# Patient Record
Sex: Male | Born: 1960 | Race: White | Hispanic: No | Marital: Single | Smoking: Current every day smoker
Health system: Southern US, Community
[De-identification: ages and names within clinical notes are randomized; demographics above are authoritative.]

## PROBLEM LIST (undated history)

## (undated) HISTORY — PX: KNEE ARTHROSCOPY: SUR90

---

## 2015-01-14 ENCOUNTER — Encounter (HOSPITAL_COMMUNITY): Payer: Self-pay

## 2015-01-14 ENCOUNTER — Inpatient Hospital Stay (HOSPITAL_COMMUNITY)
Admission: EM | Admit: 2015-01-14 | Discharge: 2015-01-19 | DRG: 637 | Disposition: A | Discharge status: Home / Self Care | Payer: 59 | Attending: Internal Medicine | Admitting: Internal Medicine

## 2015-01-14 ENCOUNTER — Emergency Department (HOSPITAL_COMMUNITY): Payer: 59

## 2015-01-14 DIAGNOSIS — E876 Hypokalemia: Secondary | ICD-10-CM | POA: Diagnosis present

## 2015-01-14 DIAGNOSIS — Z79891 Long term (current) use of opiate analgesic: Secondary | ICD-10-CM

## 2015-01-14 DIAGNOSIS — E871 Hypo-osmolality and hyponatremia: Secondary | ICD-10-CM | POA: Diagnosis present

## 2015-01-14 DIAGNOSIS — F1020 Alcohol dependence, uncomplicated: Secondary | ICD-10-CM | POA: Diagnosis present

## 2015-01-14 DIAGNOSIS — Z9181 History of falling: Secondary | ICD-10-CM | POA: Diagnosis not present

## 2015-01-14 DIAGNOSIS — E1165 Type 2 diabetes mellitus with hyperglycemia: Secondary | ICD-10-CM | POA: Diagnosis present

## 2015-01-14 DIAGNOSIS — F10230 Alcohol dependence with withdrawal, uncomplicated: Secondary | ICD-10-CM | POA: Diagnosis not present

## 2015-01-14 DIAGNOSIS — F17210 Nicotine dependence, cigarettes, uncomplicated: Secondary | ICD-10-CM | POA: Diagnosis present

## 2015-01-14 DIAGNOSIS — Z681 Body mass index (BMI) 19 or less, adult: Secondary | ICD-10-CM

## 2015-01-14 DIAGNOSIS — E43 Unspecified severe protein-calorie malnutrition: Secondary | ICD-10-CM | POA: Diagnosis present

## 2015-01-14 DIAGNOSIS — E119 Type 2 diabetes mellitus without complications: Secondary | ICD-10-CM | POA: Diagnosis not present

## 2015-01-14 DIAGNOSIS — R739 Hyperglycemia, unspecified: Secondary | ICD-10-CM | POA: Diagnosis not present

## 2015-01-14 DIAGNOSIS — R627 Adult failure to thrive: Secondary | ICD-10-CM | POA: Diagnosis present

## 2015-01-14 DIAGNOSIS — R000 Tachycardia, unspecified: Secondary | ICD-10-CM | POA: Diagnosis present

## 2015-01-14 DIAGNOSIS — F1010 Alcohol abuse, uncomplicated: Secondary | ICD-10-CM

## 2015-01-14 DIAGNOSIS — F17200 Nicotine dependence, unspecified, uncomplicated: Secondary | ICD-10-CM | POA: Diagnosis present

## 2015-01-14 DIAGNOSIS — R531 Weakness: Secondary | ICD-10-CM | POA: Diagnosis present

## 2015-01-14 LAB — MRSA PCR SCREENING: MRSA BY PCR: NEGATIVE

## 2015-01-14 LAB — CBC WITH DIFFERENTIAL/PLATELET
Basophils Absolute: 0 10*3/uL (ref 0.0–0.1)
Basophils Relative: 0 % (ref 0–1)
EOS ABS: 0 10*3/uL (ref 0.0–0.7)
Eosinophils Relative: 0 % (ref 0–5)
HCT: 38.5 % — ABNORMAL LOW (ref 39.0–52.0)
Hemoglobin: 13.7 g/dL (ref 13.0–17.0)
LYMPHS ABS: 1.4 10*3/uL (ref 0.7–4.0)
Lymphocytes Relative: 30 % (ref 12–46)
MCH: 32.6 pg (ref 26.0–34.0)
MCHC: 35.6 g/dL (ref 30.0–36.0)
MCV: 91.7 fL (ref 78.0–100.0)
MONO ABS: 0.5 10*3/uL (ref 0.1–1.0)
Monocytes Relative: 10 % (ref 3–12)
NEUTROS PCT: 60 % (ref 43–77)
Neutro Abs: 2.7 10*3/uL (ref 1.7–7.7)
Platelets: 211 10*3/uL (ref 150–400)
RBC: 4.2 MIL/uL — ABNORMAL LOW (ref 4.22–5.81)
RDW: 12.7 % (ref 11.5–15.5)
WBC: 4.5 10*3/uL (ref 4.0–10.5)

## 2015-01-14 LAB — GLUCOSE, CAPILLARY
GLUCOSE-CAPILLARY: 479 mg/dL — AB (ref 65–99)
Glucose-Capillary: 427 mg/dL — ABNORMAL HIGH (ref 65–99)

## 2015-01-14 LAB — BASIC METABOLIC PANEL
ANION GAP: 15 (ref 5–15)
BUN: 7 mg/dL (ref 6–20)
BUN: 9 mg/dL (ref 6–20)
CO2: 44 mmol/L — ABNORMAL HIGH (ref 22–32)
CO2: 40 mmol/L — ABNORMAL HIGH (ref 22–32)
CREATININE: 0.58 mg/dL — AB (ref 0.61–1.24)
Calcium: 8.4 mg/dL — ABNORMAL LOW (ref 8.9–10.3)
Calcium: 7.9 mg/dL — ABNORMAL LOW (ref 8.9–10.3)
Chloride: <65 mmol/L — CL (ref 101–111)
Chloride: 74 mmol/L — ABNORMAL LOW (ref 101–111)
Creatinine, Ser: 0.63 mg/dL (ref 0.61–1.24)
GFR calc Af Amer: >60 mL/min (ref >60)
Glucose, Bld: 431 mg/dL — ABNORMAL HIGH (ref 65–99)
Glucose, Bld: 438 mg/dL — ABNORMAL HIGH (ref 65–99)
POTASSIUM: 3.2 mmol/L — AB (ref 3.5–5.1)
Potassium: <2 mmol/L — CL (ref 3.5–5.1)
SODIUM: 129 mmol/L — AB (ref 135–145)
Sodium: 126 mmol/L — ABNORMAL LOW (ref 135–145)

## 2015-01-14 LAB — HEPATIC FUNCTION PANEL
ALT: 13 U/L — AB (ref 17–63)
AST: 17 U/L (ref 15–41)
Albumin: 3 g/dL — ABNORMAL LOW (ref 3.5–5.0)
Alkaline Phosphatase: 128 U/L — ABNORMAL HIGH (ref 38–126)
Bilirubin, Direct: 0.3 mg/dL (ref 0.1–0.5)
Indirect Bilirubin: 0.7 mg/dL (ref 0.3–0.9)
TOTAL PROTEIN: 6 g/dL — AB (ref 6.5–8.1)
Total Bilirubin: 1 mg/dL (ref 0.3–1.2)

## 2015-01-14 LAB — PHOSPHORUS: Phosphorus: 3.1 mg/dL (ref 2.5–4.6)

## 2015-01-14 LAB — MAGNESIUM: MAGNESIUM: 1.6 mg/dL — AB (ref 1.7–2.4)

## 2015-01-14 MED ORDER — SODIUM CHLORIDE 0.9 % IV BOLUS (SEPSIS)
500.0000 mL | Freq: Once | INTRAVENOUS | Status: AC
  Administered 2015-01-14: 500 mL via INTRAVENOUS

## 2015-01-14 MED ORDER — POTASSIUM CHLORIDE CRYS ER 20 MEQ PO TBCR
40.0000 meq | EXTENDED_RELEASE_TABLET | Freq: Once | ORAL | Status: AC
  Administered 2015-01-14: 40 meq via ORAL
  Filled 2015-01-14: qty 2

## 2015-01-14 MED ORDER — INSULIN GLARGINE 100 UNIT/ML ~~LOC~~ SOLN
5.0000 [IU] | Freq: Once | SUBCUTANEOUS | Status: AC
  Administered 2015-01-14: 5 [IU] via SUBCUTANEOUS
  Filled 2015-01-14: qty 0.05

## 2015-01-14 MED ORDER — NICOTINE 14 MG/24HR TD PT24
14.0000 mg | MEDICATED_PATCH | Freq: Every day | TRANSDERMAL | Status: DC
  Administered 2015-01-14 – 2015-01-19 (×6): 14 mg via TRANSDERMAL
  Filled 2015-01-14 (×6): qty 1

## 2015-01-14 MED ORDER — LORAZEPAM 1 MG PO TABS
0.0000 mg | ORAL_TABLET | Freq: Four times a day (QID) | ORAL | Status: AC
Start: 2015-01-14 — End: 2015-01-16
  Administered 2015-01-14 – 2015-01-15 (×3): 1 mg via ORAL
  Filled 2015-01-14 (×2): qty 1
  Filled 2015-01-14: qty 4
  Filled 2015-01-14: qty 1

## 2015-01-14 MED ORDER — VITAMIN B-1 100 MG PO TABS
100.0000 mg | ORAL_TABLET | Freq: Once | ORAL | Status: AC
  Administered 2015-01-14: 100 mg via ORAL
  Filled 2015-01-14: qty 1

## 2015-01-14 MED ORDER — POTASSIUM CHLORIDE 10 MEQ/100ML IV SOLN
10.0000 meq | Freq: Once | INTRAVENOUS | Status: AC
  Administered 2015-01-14: 10 meq via INTRAVENOUS
  Filled 2015-01-14: qty 100

## 2015-01-14 MED ORDER — LORAZEPAM 1 MG PO TABS
1.0000 mg | ORAL_TABLET | Freq: Four times a day (QID) | ORAL | Status: DC | PRN
  Administered 2015-01-15: 1 mg via ORAL
  Filled 2015-01-14: qty 1

## 2015-01-14 MED ORDER — LORAZEPAM 2 MG/ML IJ SOLN: 1.0000 mg | Freq: Four times a day (QID) | INTRAMUSCULAR | Status: DC | PRN

## 2015-01-14 MED ORDER — PROMETHAZINE HCL 25 MG PO TABS
12.5000 mg | ORAL_TABLET | Freq: Four times a day (QID) | ORAL | Status: DC | PRN
  Administered 2015-01-16 – 2015-01-17 (×2): 12.5 mg via ORAL
  Filled 2015-01-14 (×2): qty 1

## 2015-01-14 MED ORDER — INSULIN ASPART 100 UNIT/ML ~~LOC~~ SOLN: 8.0000 [IU] | Freq: Once | SUBCUTANEOUS | Status: DC

## 2015-01-14 MED ORDER — POTASSIUM CHLORIDE 10 MEQ/100ML IV SOLN
10.0000 meq | INTRAVENOUS | Status: AC
  Administered 2015-01-14 – 2015-01-15 (×3): 10 meq via INTRAVENOUS
  Filled 2015-01-14: qty 100

## 2015-01-14 MED ORDER — THIAMINE HCL 100 MG/ML IJ SOLN
100.0000 mg | Freq: Every day | INTRAMUSCULAR | Status: DC
  Filled 2015-01-14 (×2): qty 1

## 2015-01-14 MED ORDER — SODIUM CHLORIDE 0.9 % IV BOLUS (SEPSIS)
1000.0000 mL | Freq: Once | INTRAVENOUS | Status: AC
  Administered 2015-01-14: 1000 mL via INTRAVENOUS

## 2015-01-14 MED ORDER — ADULT MULTIVITAMIN W/MINERALS CH
1.0000 | ORAL_TABLET | Freq: Every day | ORAL | Status: DC
  Administered 2015-01-14 – 2015-01-19 (×6): 1 via ORAL
  Filled 2015-01-14 (×6): qty 1

## 2015-01-14 MED ORDER — INSULIN ASPART 100 UNIT/ML ~~LOC~~ SOLN
0.0000 [IU] | Freq: Every day | SUBCUTANEOUS | Status: DC
  Administered 2015-01-14: 4 [IU] via SUBCUTANEOUS
  Administered 2015-01-15: 5 [IU] via SUBCUTANEOUS

## 2015-01-14 MED ORDER — FOLIC ACID 1 MG PO TABS
1.0000 mg | ORAL_TABLET | Freq: Every day | ORAL | Status: DC
  Administered 2015-01-14 – 2015-01-19 (×6): 1 mg via ORAL
  Filled 2015-01-14 (×6): qty 1

## 2015-01-14 MED ORDER — SODIUM CHLORIDE 0.9 % IJ SOLN
3.0000 mL | Freq: Two times a day (BID) | INTRAMUSCULAR | Status: DC
  Administered 2015-01-14 – 2015-01-18 (×7): 3 mL via INTRAVENOUS

## 2015-01-14 MED ORDER — OXYCODONE-ACETAMINOPHEN 5-325 MG PO TABS
1.0000 | ORAL_TABLET | ORAL | Status: DC | PRN
  Administered 2015-01-15 – 2015-01-19 (×6): 1 via ORAL
  Filled 2015-01-14 (×6): qty 1

## 2015-01-14 MED ORDER — VITAMIN B-1 100 MG PO TABS
100.0000 mg | ORAL_TABLET | Freq: Every day | ORAL | Status: DC
  Administered 2015-01-14 – 2015-01-19 (×6): 100 mg via ORAL
  Filled 2015-01-14 (×6): qty 1

## 2015-01-14 MED ORDER — LACTATED RINGERS IV SOLN
INTRAVENOUS | Status: DC
  Administered 2015-01-14 – 2015-01-16 (×4): via INTRAVENOUS
  Filled 2015-01-14 (×10): qty 1000

## 2015-01-14 MED ORDER — POTASSIUM CHLORIDE 10 MEQ/100ML IV SOLN
10.0000 meq | INTRAVENOUS | Status: AC
  Administered 2015-01-14 (×4): 10 meq via INTRAVENOUS
  Filled 2015-01-14 (×4): qty 100

## 2015-01-14 MED ORDER — INSULIN ASPART 100 UNIT/ML ~~LOC~~ SOLN
0.0000 [IU] | Freq: Three times a day (TID) | SUBCUTANEOUS | Status: DC
  Administered 2015-01-14: 15 [IU] via SUBCUTANEOUS
  Administered 2015-01-15: 3 [IU] via SUBCUTANEOUS
  Administered 2015-01-15 – 2015-01-16 (×4): 8 [IU] via SUBCUTANEOUS
  Administered 2015-01-16 – 2015-01-17 (×2): 3 [IU] via SUBCUTANEOUS
  Administered 2015-01-17: 2 [IU] via SUBCUTANEOUS
  Administered 2015-01-17: 3 [IU] via SUBCUTANEOUS
  Administered 2015-01-18: 8 [IU] via SUBCUTANEOUS
  Administered 2015-01-18: 3 [IU] via SUBCUTANEOUS

## 2015-01-14 MED ORDER — HEPARIN SODIUM (PORCINE) 5000 UNIT/ML IJ SOLN
5000.0000 [IU] | Freq: Three times a day (TID) | INTRAMUSCULAR | Status: DC
  Administered 2015-01-14 – 2015-01-17 (×10): 5000 [IU] via SUBCUTANEOUS
  Filled 2015-01-14 (×12): qty 1

## 2015-01-14 MED ORDER — LORAZEPAM 1 MG PO TABS
0.0000 mg | ORAL_TABLET | Freq: Two times a day (BID) | ORAL | Status: AC
Start: 2015-01-16 — End: 2015-01-18

## 2015-01-14 NOTE — Progress Notes (Signed)
Pts daughters Judeth CornfieldStephanie and Helmut Musterlicia spoke with RN in private regarding there fathers living situation. They chose to speak in private because he would deny how bad it was. 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 He hoards trash and has maggotts and black widow spiders in his apartment. They say he is unable to bathe himself because he has feces and urine in his tub. When they went to his apartment to take him to the hospital today, he had feces down the back of his leg. They say he has around 3 feet of trash built up in his apartment and this is what makes him fall. He claims that it is because "his knees get weak." They are hoping to get resources for housing. They also state that he will be losing his job in the next couple days including his health insurance. They would like information on possible resources for this as well. The patient has not seen a physician in 25 years. Daughters are more than willing to speak/be called if needed. Phone numbers will be added to the chart.

## 2015-01-14 NOTE — ED Notes (Signed)
Pt presents with episodes of dizziness and near falls over the past several weeks.  Pt states he has been drinking liquor heavily over the past several weeks and has hx of ETOH abuse.  Pt denies headache, chest pain, fever, recent illness.  Pt states diet has been poor recently.

## 2015-01-14 NOTE — H&P (Signed)
[facility] History and Physical  Giovanne Nickolson RUE:[id number] DOB: [birth year] DOA: 01/14/2015  Referring physician: Dr. Littie Deeds PCP: No primary care provider on file.   Chief Complaint: Dizziness and weakness  HPI: Darrell Day is a 54 y.o. male  Who presented to the hospital with complaints of dizziness and weakness. This apparently has been going on for several days prior to admission. Patient states that he drinks alcohol daily. Finishing a fifth of whiskey daily. His weakness and dizziness has been persistent since onset and currently not improving. He has no other symptoms or complaints. He also reports he has not been eating well for the last few weeks. He denies any fevers or chills or sick contacts.  While the in the emergency department patient was found to have profound hypokalemia and hyponatremia. We were consulted for medical evaluation and recommendations.   Review of Systems:  Constitutional:  No weight loss, night sweats, Fevers, chills, fatigue.  HEENT:  No headaches, Difficulty swallowing,Tooth/dental problems,Sore throat,  No sneezing, itching, ear ache, nasal congestion, post nasal drip,  Cardio-vascular:  No chest pain, Orthopnea, PND, swelling in lower extremities, anasarca, + dizziness, palpitations  GI:  No heartburn, indigestion, abdominal pain, nausea, vomiting, diarrhea, change in bowel habits, loss of appetite  Resp:  No shortness of breath with exertion or at rest. No excess mucus, no productive cough, No non-productive cough, No coughing up of blood.No change in color of mucus.No wheezing.No chest wall deformity  Skin:  no rash or lesions.  GU:  no dysuria, change in color of urine, no urgency or frequency. No flank pain.  Musculoskeletal:  No joint pain or swelling. No decreased range of motion. No back pain.  Psych:  No change in mood or affect. No depression or anxiety. No memory loss.   History reviewed. No pertinent past medical  history. Past Surgical History  Procedure Laterality Date  . Knee arthroscopy Right    Social History:  reports that he has been smoking Cigarettes.  He has been smoking about 2.00 packs per day. He does not have any smokeless tobacco history on file. He reports that he drinks alcohol. He reports that he does not use illicit drugs.  No Known Allergies  Family history - None reported when asked directly  Prior to Admission medications   Medication Sig Start Date End Date Taking? Authorizing Provider  oxyCODONE-acetaminophen (PERCOCET/ROXICET) 5-325 MG per tablet Take 1 tablet by mouth every 6 (six) hours as needed for severe pain.  12/23/14  Yes Historical Provider, MD   Physical Exam: Filed Vitals:   01/14/15 0947 01/14/15 1230 01/14/15 1300 01/14/15 1315  BP: 108/77 104/72 106/75 113/83  Pulse:  88 91 92  Temp:      TempSrc:      Resp:  Height:    (1.676 m)   Weight:   51.9 kg (114 lb 6.7 oz)   SpO2: 96% 95% 92% 98%    Wt Readings from Last 3 Encounters:  01/14/15 51.9 kg (114 lb 6.7 oz)    General:  Appears calm and comfortable Eyes: PERRL, normal lids, irises & conjunctiva ENT: grossly normal hearing, lips & tongue Neck: no LAD, masses or thyromegaly Cardiovascular: RRR, no m/r/g. No LE edema. Respiratory: CTA bilaterally, no w/r/r. Normal respiratory effort. Abdomen: soft, nt, nd Skin: no rash or induration seen on limited exam Musculoskeletal: grossly normal tone BUE/BLE Psychiatric: grossly normal mood and affect, speech fluent and appropriate Neurologic: Answers questions appropriately moves extremities  equally           Labs on Admission:  Basic Metabolic Panel:  Recent Labs Lab 01/14/15 1003  NA 126*  K <2.0*  CL <65*  CO2 44*  GLUCOSE 431*  BUN 7  CREATININE 0.58*  CALCIUM 8.4*  MG 1.6*   Liver Function Tests:  Recent Labs Lab 01/14/15 1003  AST 17  ALT 13*  ALKPHOS 128*  BILITOT 1.0  PROT 6.0*  ALBUMIN 3.0*   No  results for input(s): LIPASE, AMYLASE in the last 168 hours. No results for input(s): AMMONIA in the last 168 hours. CBC:  Recent Labs Lab 01/14/15 1003  WBC 4.5  NEUTROABS 2.7  HGB 13.7  HCT 38.5*  MCV 91.7  PLT 211   Cardiac Enzymes: No results for input(s): CKTOTAL, CKMB, CKMBINDEX, TROPONINI in the last 168 hours.  BNP (last 3 results) No results for input(s): BNP in the last 8760 hours.  ProBNP (last 3 results) No results for input(s): PROBNP in the last 8760 hours.  CBG: No results for input(s): GLUCAP in the last 168 hours.  Radiological Exams on Admission: Dg Chest 2 View  01/14/2015   CLINICAL DATA:  Weight loss, weakness.  EXAM: CHEST  2 VIEW  COMPARISON:  None.  FINDINGS: The heart size and mediastinal contours are within normal limits. Both lungs are clear. No pneumothorax or pleural effusion is noted. The visualized skeletal structures are unremarkable.  IMPRESSION: No active cardiopulmonary disease.   Electronically Signed   By: Lupita RaiderJames  Green Jr, M.D.   On: 01/14/2015 11:02    EKG: Independently reviewed. Sinus tachycardia  Assessment/Plan Principal Problem:   Hypokalemia - replaced orally and IV - Will reassess next am - telemetry monitoring - obtain magnesium level  Active Problems:   Hyperglycemia - obtain hemoglobin a1c - Place on SSI    Nicotine dependence - nicotine patch    Hyponatremia - most likely poor oral solute intake - place on IVF's and advance diet    Alcohol dependence - [place] protocol   Code Status: full DVT Prophylaxis: heparin Family Communication: discussed with patient and family member at bedside Disposition Plan: Pending improvement in condition. For now Stepdown  Time spent: > 65 minutes  [name], Karenann Mcgrory [facility] Pager [phone]

## 2015-01-14 NOTE — Care Management Note (Signed)
Case Management Note  Patient Details  Name: Darrell Day Not[place] MRN: [id number] Date of Birth: March 14, 1961  Subjective/Objective:       54 yr old male united health care choice plus [place] resident No pcp and has not been seen by one in "twenty or more years" as agreed by pt and Daughter Judeth CornfieldStephanie as support system who is pregnant with pt third grandchild- a granddaughter Pt reports he is looking forward to this and reports "I have made the first step" with encouragement of Judeth CornfieldStephanie. Pt presently works and admits to use of alcohol "lots at night"  He notes his falls "mostly in mornings with dizziness", "cane is not near me when I fall" He believes fall due to drinking and dizziness.  Pt with recent right knee arthoplasty by Dr Luiz BlareGraves, orthopedic.  D/c after procedure with no home health services and pt states he has recently "been cleared to go back to work" by Ortho MD.  "If I don't work I don't eat"  Judeth CornfieldStephanie voiced concern that she did not believe pt had properly taken care of his right knee at home and showed CM both knees for comparison.  CM noted scarring on both knees with noted right knee outer aspect swelling compared to the right.  Referred pt to admitting MD Pt reports not having home health at any time previously and only DME is [facility]. Pt voiced understanding of need of pcp. Noted Judeth CornfieldStephanie did not offer to be pt primary caregiver when criteria for home health reviewed with her and pt.  Judeth CornfieldStephanie has attempted to get pt an appt with her dr but the appts have been a month out Judeth CornfieldStephanie agrees to use the list of pcp to find an accepting in network pcp for pt prior to d/c to assist with criteria for home health               Action/Plan:  Cm discussed the importance of pcp vs ED or hospital MDs for f/u care.  Pt given 12 page list of united health care choice plus pcp within 5 miles of zip [phone] Provided stephanie with [email or web address] to used to access more  providers if needed Discuss use also of customer services with pt permission to find providers.  CM reviewed in details admission guidelines for observation and inpatient, home health [facility](HH) (length of stay in home, [facility], coverage, primary caregiver, up to 24 hrs before services may be started, pt has to be house bound, pt need primary caregiver and a pcp) and Private duty nursing (PDN-coverage, length of stay in the home types of staff available). Discussed cost of PDN for working [place] pt generally out of pocket expense.  CM reviewed availability of HH SW to assist pcp to get pt to snf (if desired disposition) from the community level. CM provided pt/family with a list of [place] home health agencies and PDN. CM discussed average LOS for admission medical issues Request pt and dtr review of home health list to be able to provide their choice when needed  Expected Discharge Date:   January 17 2015                Expected Discharge Plan:    Home with possible home health services   In-House Referral:   na   Status of Service:   ED CM services completed    Additional Comments:  Ophelia ShoulderGibbs, Kimberly Louise, RN 01/14/2015, 12:31 PM

## 2015-01-14 NOTE — ED Notes (Signed)
Bed: WA22 Expected date:  Expected time:  Means of arrival:  Comments: 

## 2015-01-14 NOTE — ED Notes (Signed)
Dr. Littie DeedsGentry made aware of patients critical lab values - K = <2, Chloride <65.

## 2015-01-14 NOTE — ED Notes (Signed)
Pt c/o intermittent dizziness and increases falls x 1 month and anorexia x 2 days.  Pain score 4/10.  Pt reports having knee surgery x 1 month ago and sts "it's all gone down here since then."  Family is concerned about failure to thrive and alcoholism.

## 2015-01-14 NOTE — ED Provider Notes (Signed)
CSN: [id number]     Arrival date & time 01/14/15  0900 History   First MD Initiated Contact with Patient 01/14/15 0945     Chief Complaint  Patient presents with  . Dizziness  . Fall  . Failure To Thrive     (Consider location/radiation/quality/duration/timing/severity/associated sxs/prior Treatment) HPI 54 y.o. male presents with dizziness, lightheadedness, inability to care for self from home. Patient is a long-standing history of alcohol abuse, has had 2 weeks of essentially not eating or drinking or taking any care of himself. Family members insisted that the patient come to the emergency room in today. He denies associated fever, nausea, vomiting. He drinks heavily every day. Per family, the patient lies around at home in piles of dog feces.  They state that the house is in a serious state of disrepair. Timing of dizziness is constant, exacerbated by standing, no associated focal numbness, weakness. History reviewed. No pertinent past medical history. Past Surgical History  Procedure Laterality Date  . Knee arthroscopy Right    History reviewed. No pertinent family history. History  Substance Use Topics  . Smoking status: Current Every Day Smoker -- 2.00 packs/day    Types: Cigarettes  . Smokeless tobacco: Not on file  . Alcohol Use: Yes     Comment: Family reports 3-4 1/2 gallons of whiskey per week    Review of Systems  All other systems reviewed and are negative.     Allergies  Review of patient's allergies indicates no known allergies.  Home Medications   Prior to Admission medications   Medication Sig Start Date End Date Taking? Authorizing Provider  oxyCODONE-acetaminophen (PERCOCET/ROXICET) 5-325 MG per tablet Take 1 tablet by mouth every 6 (six) hours as needed for severe pain.  12/23/14  Yes Historical Provider, MD   BP 122/77 mmHg  Pulse 85  Temp(Src) 98.1 F (36.7 C) (Oral)  Resp 14  Ht 5\' 6"  (1.676 m)  Wt 114 lb 6.7 oz (51.9 kg)  BMI 18.48 kg/m2   SpO2 96% Physical Exam  Constitutional: He is oriented to person, place, and time. He appears well-developed and well-nourished.  HENT:  Head: Normocephalic and atraumatic.  Eyes: Conjunctivae and EOM are normal.  Neck: Normal range of motion. Neck supple.  Cardiovascular: Normal rate, regular rhythm and normal heart sounds.   Pulmonary/Chest: Effort normal and breath sounds normal. No respiratory distress.  Abdominal: He exhibits no distension. There is no tenderness. There is no rebound and no guarding.  Musculoskeletal: Normal range of motion.  Neurological: He is alert and oriented to person, place, and time. He has normal strength. No cranial nerve deficit or sensory deficit. Coordination normal.  No focal neuro deficits  Skin: Skin is warm and dry.  Vitals reviewed.   ED Course  Procedures (including critical care time) Labs Review Labs Reviewed  CBC WITH DIFFERENTIAL/PLATELET - Abnormal; Notable for the following:    RBC 4.20 (*)    HCT 38.5 (*)    All other components within normal limits  BASIC METABOLIC PANEL - Abnormal; Notable for the following:    Sodium 126 (*)    Potassium <2.0 (*)    Chloride <65 (*)    CO2 44 (*)    Glucose, Bld 431 (*)    Creatinine, Ser 0.58 (*)    Calcium 8.4 (*)    All other components within normal limits  HEPATIC FUNCTION PANEL - Abnormal; Notable for the following:    Total Protein 6.0 (*)    Albumin 3.0 (*)  ALT 13 (*)    Alkaline Phosphatase 128 (*)    All other components within normal limits  MAGNESIUM - Abnormal; Notable for the following:    Magnesium 1.6 (*)    All other components within normal limits  HEMOGLOBIN A1C - Abnormal; Notable for the following:    Hgb A1c MFr Bld 12.4 (*)    All other components within normal limits  COMPREHENSIVE METABOLIC PANEL - Abnormal; Notable for the following:    Sodium 131 (*)    Potassium 3.1 (*)    Chloride 80 (*)    CO2 44 (*)    Glucose, Bld 178 (*)    Creatinine, Ser 0.55  (*)    Calcium 7.7 (*)    Total Protein 5.3 (*)    Albumin 2.6 (*)    AST 13 (*)    ALT 9 (*)    All other components within normal limits  CBC - Abnormal; Notable for the following:    RBC 3.79 (*)    Hemoglobin 12.1 (*)    HCT 35.9 (*)    All other components within normal limits  GLUCOSE, CAPILLARY - Abnormal; Notable for the following:    Glucose-Capillary 479 (*)    All other components within normal limits  BASIC METABOLIC PANEL - Abnormal; Notable for the following:    Sodium 129 (*)    Potassium 3.2 (*)    Chloride 74 (*)    CO2 40 (*)    Glucose, Bld 438 (*)    Calcium 7.9 (*)    All other components within normal limits  GLUCOSE, CAPILLARY - Abnormal; Notable for the following:    Glucose-Capillary 427 (*)    All other components within normal limits  GLUCOSE, CAPILLARY - Abnormal; Notable for the following:    Glucose-Capillary 144 (*)    All other components within normal limits  GLUCOSE, CAPILLARY - Abnormal; Notable for the following:    Glucose-Capillary 278 (*)    All other components within normal limits  MRSA PCR SCREENING  PHOSPHORUS  MAGNESIUM    Imaging Review Dg Chest 2 View  01/14/2015   CLINICAL DATA:  Weight loss, weakness.  EXAM: CHEST  2 VIEW  COMPARISON:  None.  FINDINGS: The heart size and mediastinal contours are within normal limits. Both lungs are clear. No pneumothorax or pleural effusion is noted. The visualized skeletal structures are unremarkable.  IMPRESSION: No active cardiopulmonary disease.   Electronically Signed   By: Lupita Raider, M.D.   On: 01/14/2015 11:02     EKG Interpretation   Date/Time:  Tuesday January 14 2015 09:43:48 EDT Ventricular Rate:  101 PR Interval:  151 QRS Duration: 98 QT Interval:  379 QTC Calculation: 491 R Axis:   94 Text Interpretation:  Sinus tachycardia Borderline right axis deviation  Anteroseptal infarct, age indeterminate Borderline repolarization  abnormality No old tracing to compare  Confirmed by Mirian Mo [phone])  on 01/14/2015 10:03:50 AM      MDM   Final diagnoses:  None    54 y.o. male with pertinent PMH of chronic ETOH use presents with essentially failure to thrive.  Physical exam without focal neuro deficits.  Wu with hypokalemia, likely due to malnourishment.  Admitted in stable condition.    I have reviewed all laboratory and imaging studies if ordered as above  Malnourishment Failure to thrive Hypokalemia    Mirian Mo, MD 01/15/15 [phone]

## 2015-01-15 LAB — CBC
HCT: 35.9 % — ABNORMAL LOW (ref 39.0–52.0)
Hemoglobin: 12.1 g/dL — ABNORMAL LOW (ref 13.0–17.0)
MCH: 31.9 pg (ref 26.0–34.0)
MCHC: 33.7 g/dL (ref 30.0–36.0)
MCV: 94.7 fL (ref 78.0–100.0)
Platelets: 169 10*3/uL (ref 150–400)
RBC: 3.79 MIL/uL — ABNORMAL LOW (ref 4.22–5.81)
RDW: 13 % (ref 11.5–15.5)
WBC: 5.5 10*3/uL (ref 4.0–10.5)

## 2015-01-15 LAB — COMPREHENSIVE METABOLIC PANEL
ALBUMIN: 2.6 g/dL — AB (ref 3.5–5.0)
ALT: 9 U/L — ABNORMAL LOW (ref 17–63)
ANION GAP: 7 (ref 5–15)
AST: 13 U/L — ABNORMAL LOW (ref 15–41)
Alkaline Phosphatase: 114 U/L (ref 38–126)
BILIRUBIN TOTAL: 0.9 mg/dL (ref 0.3–1.2)
BUN: 11 mg/dL (ref 6–20)
CO2: 44 mmol/L — ABNORMAL HIGH (ref 22–32)
Calcium: 7.7 mg/dL — ABNORMAL LOW (ref 8.9–10.3)
Chloride: 80 mmol/L — ABNORMAL LOW (ref 101–111)
Creatinine, Ser: 0.55 mg/dL — ABNORMAL LOW (ref 0.61–1.24)
GFR calc Af Amer: >60 mL/min (ref >60)
GFR calc non Af Amer: >60 mL/min (ref >60)
Glucose, Bld: 178 mg/dL — ABNORMAL HIGH (ref 65–99)
POTASSIUM: 3.1 mmol/L — AB (ref 3.5–5.1)
Sodium: 131 mmol/L — ABNORMAL LOW (ref 135–145)
TOTAL PROTEIN: 5.3 g/dL — AB (ref 6.5–8.1)

## 2015-01-15 LAB — HEMOGLOBIN A1C
Hgb A1c MFr Bld: 12.4 % — ABNORMAL HIGH (ref 4.8–5.6)
MEAN PLASMA GLUCOSE: 309 mg/dL

## 2015-01-15 LAB — GLUCOSE, CAPILLARY
GLUCOSE-CAPILLARY: 178 mg/dL — AB (ref 65–99)
GLUCOSE-CAPILLARY: 373 mg/dL — AB (ref 65–99)
Glucose-Capillary: 144 mg/dL — ABNORMAL HIGH (ref 65–99)
Glucose-Capillary: 278 mg/dL — ABNORMAL HIGH (ref 65–99)
Glucose-Capillary: 260 mg/dL — ABNORMAL HIGH (ref 65–99)

## 2015-01-15 LAB — MAGNESIUM: MAGNESIUM: 1.7 mg/dL (ref 1.7–2.4)

## 2015-01-15 MED ORDER — POTASSIUM CHLORIDE CRYS ER 20 MEQ PO TBCR
40.0000 meq | EXTENDED_RELEASE_TABLET | Freq: Once | ORAL | Status: AC
  Administered 2015-01-15: 40 meq via ORAL
  Filled 2015-01-15: qty 2

## 2015-01-15 MED ORDER — PRO-STAT 64 PO LIQD
30.0000 mL | Freq: Two times a day (BID) | ORAL | Status: DC
  Administered 2015-01-15 – 2015-01-19 (×9): 30 mL via ORAL
  Filled 2015-01-15 (×10): qty 30

## 2015-01-15 NOTE — Progress Notes (Signed)
Initial Nutrition Assessment  DOCUMENTATION CODES:  Severe malnutrition in context of social or environmental circumstances, Underweight  INTERVENTION: - Will order Prostat BID, each supplement provides 100 kcal and 15 grams of protein - RD will continue to monitor for needs  NUTRITION DIAGNOSIS:  Malnutrition related to social / environmental circumstances as evidenced by severe depletion of muscle mass, severe depletion of body fat.  GOAL:  Patient will meet greater than or equal to 90% of their needs  MONITOR:  PO intake, Supplement acceptance, Weight trends, Labs, I & O's  REASON FOR ASSESSMENT:  Malnutrition Screening Tool  ASSESSMENT: 54 y.o. male who presented to the hospital with complaints of dizziness and weakness. This apparently has been going on for several days prior to admission. Patient states that he drinks alcohol daily. Finishing a fifth of whiskey daily. His weakness and dizziness has been persistent since onset and currently not improving. He has no other symptoms or complaints. He also reports he has not been eating well for the last few weeks.  Pt seen for MST. BMI indicates underweight status. Pt reports 100% intakes of 3 meals since admission and that for breakfast he had jello, eggs, grits, sausage, and toast. Pt states that PTA the last time he ate was a hot ham and cheese sandwich from [employer] on 01/10/15.  Per note reviews, pt with alcohol abuse and was not eating or drinking (fluids other than alcohol) or taking care of himself for 2 weeks PTA. Also indicates anorexia x2 days PTA. MST report indicates 30 lb weight loss in 6 months. This would indicate 21% body weight loss in this time.  Pt denies abdominal pain or nausea with PO intakes. Possibility of refeeding present. Physical assessment indicates severe muscle and fat wasting.  Medications reviewed. Labs reviewed; CBGs: 144-479 mg/dL, Na: 130131 mmol/L, Cl: 80 mmol/L, K: 3.1 mmol/L, creatinine  elevated, Ca: 7.7 mg/dL, Mg and  Phos WDL.   Height:  Ht Readings from Last 1 Encounters:  01/14/15 5\' 6"  (1.676 m)    Weight:  Wt Readings from Last 1 Encounters:  01/14/15 114 lb 6.7 oz (51.9 kg)    Ideal Body Weight:  64.5 kg (kg)  Wt Readings from Last 10 Encounters:  01/14/15 114 lb 6.7 oz (51.9 kg)    BMI:  Body mass index is 18.48 kg/(m^2).  Estimated Nutritional Needs:  Kcal:  1600-1800  Protein:  60-70 grams  Fluid:  2.5 L/day  Skin:  Reviewed, no issues  Diet Order:  Diet Carb Modified Fluid consistency:: Thin; Room service appropriate?: Yes  EDUCATION NEEDS:  No education needs identified at this time   Intake/Output Summary (Last 24 hours) at 01/15/15 1041 Last data filed at 01/15/15 1000  Gross per 24 hour  Intake 3243.33 ml  Output   1150 ml  Net 2093.33 ml    Last BM:  PTA    Trenton GammonJessica Eric Morganti, RD, LDN Inpatient Clinical Dietitian Pager # [phone] After hours/weekend pager # [phone]

## 2015-01-15 NOTE — Progress Notes (Signed)
Inpatient Diabetes Program Recommendations  AACE/ADA: New Consensus Statement on Inpatient Glycemic Control (2013)  Target Ranges:  Prepandial:   less than 140 mg/dL      Peak postprandial:   less than 180 mg/dL (1-2 hours)      Critically ill patients:  140 - 180 mg/dL   Reason for Visit: Diabetes Consult  Diabetes history: None Outpatient Diabetes medications: None Current orders for Inpatient glycemic control: Lantus 5 (1x), Novolog moderate tidwc and hs  Pt states he hasn't seen a doctor in over 20 years. 54 year old male admitted with dizziness, falls, and anorexia. No hx DM. No family hx DM. C/O polyuria, polydipsia. States he would like to quit drinking.  Results for SAFWAN, TOMEI (MRN [id number]) as of 01/15/2015 12:56  Ref. Range 01/14/2015 17:36 01/14/2015 19:34 01/15/2015 01:58 01/15/2015 07:34 01/15/2015 12:20  Glucose-Capillary Latest Ref Range: 65-99 mg/dL 479 (H) 427 (H) 144 (H) 278 (H) 260 (H)   Results for JAMESYN, LINDELL (MRN [id number]) as of 01/15/2015 12:56  Ref. Range 01/14/2015 13:55  Hemoglobin A1C Latest Ref Range: 4.8-5.6 % 12.4 (H)  Results for LAMON, ROTUNDO (MRN [id number]) as of 01/15/2015 12:56  Ref. Range 01/15/2015 03:53  Sodium Latest Ref Range: 135-145 mmol/L 131 (L)  Potassium Latest Ref Range: 3.5-5.1 mmol/L 3.1 (L)  Chloride Latest Ref Range: 101-111 mmol/L 80 (L)  CO2 Latest Ref Range: 22-32 mmol/L 44 (H)  BUN Latest Ref Range: 6-20 mg/dL 11  Creatinine Latest Ref Range: 0.61-1.24 mg/dL 0.55 (L)  Calcium Latest Ref Range: 8.9-10.3 mg/dL 7.7 (L)  EGFR (Non-African Amer.) Latest Ref Range: >60 mL/min >60  EGFR (African American) Latest Ref Range: >60 mL/min >60  Glucose Latest Ref Range: 65-99 mg/dL 178 (H)  Anion gap Latest Ref Range: 5-15  7   New-onset DM. Will order Living Well With Diabetes book and encouraged pt to view diabetes videos on pt ed channel. Spoke with pt about new diabetes diagnosis. Discussed A1C results and explained what an A1C is,  basic pathophysiology of DM Type 2, basic home care, importance of checking CBGs and maintaining good CBG control to prevent long-term and short-term complications.  RNs to provide ongoing basic DM education at bedside with this patient and engage patient to actively check blood glucose.  Inpatient Diabetes Program Recommendations Insulin - Basal: Lantus 5 units bid Insulin - Meal Coverage: Add Novolog 3 units tidwc for meal coverage insulin HgbA1C: 12.4% - uncontrolled  Note: If pt is to go home on insulin, will need insulin starter kit and RN to begin teaching insulin administration. Pt willing to go to [facility] for MD to manage diabetes. Will need prescription for meter and supplies at discharge. Was not interested in OP Diabetes Education at this time.  Will continue to follow. Thank you. Lorenda Peck, RD, LDN, CDE Inpatient Diabetes Coordinator [phone]

## 2015-01-15 NOTE — Clinical Social Work Note (Signed)
Clinical Social Work Assessment  Patient Details  Name: Darrell Day MRN: [id number] Date of Birth: 1961/02/03  Date of referral:  01/15/15               Reason for consult:  Abuse/Neglect, Housing Concerns/Homelessness, Family Concerns, Substance Use/ETOH Abuse                Permission sought to share information with:  Family Supports Permission granted to share information::  Yes, Verbal Permission Granted  Name::     Advice worker::     Relationship::  dtr  Contact Information:     Housing/Transportation Living arrangements for the past 2 months:  Single Family Home Source of Information:  Patient Patient Interpreter Needed:  None Criminal Activity/Legal Involvement Pertinent to Current Situation/Hospitalization:  No - Comment as needed Significant Relationships:  Adult Children Lives with:  Self Do you feel safe going back to the place where you live?  Yes Need for family participation in patient care:  Yes (Comment)  Care giving concerns:  Dtrs called RN and reported concerns with patient returning home because he is not properly caring for himself and home environment is not clean.   Social Worker assessment / plan:  CSW received referral to complete assessment. CSW reviewed chart and met with patient at bedside. CSW introduced myself and explained role.  Patient reports he lives at home alone and works as a Architectural technologist. Patient has two dtrs and 2 grandchildren. Patient reports that he recently had knee surgery but came to the hospital due to recent falls and feeling dizzy. When CSW asked about any medical concerns patient reports that he is probably feeling bad because he has been drinking alcohol on a daily basis. Patient open to completing SBIRT and discussing substance use.  Patient reports he has been drinking heavily for over 10 years. Patient currently drinks everyday after work until he goes to sleep. Patient enjoys drinking whiskey and consumes anywhere  from a couple of drinks to a fifth of liquor. Patient reports that his alcohol use increased after his father passed away because he did not know how to manage depression. Patient does not enjoy going to the doctor and is not receptive to any medication for depression. Patient reports he feels that depression has decreased his appetite but denies any SI or HI and contacts for safety.  Patient reports he has been sober in the past and feels he can be sober again. Patient is hopeful that he can remain sober so that he does not have any further medical complications. Patient denies any previous counseling or therapy. Patient has been sober in the past but has never attended any treatment. Patient reports if he wants to get sober then he could do it on his own and would not need any assistance.  CSW spoke with patient re: dtrs concerns. Patient reports that he knows that family wants the best for him and gave permission for CSW to contact dtr Darrell Day). Patient reports he does not have any safety concerns with returning home and feels that his house is in good condition. CSW called dtr and left a message with CSW contact information.  CSW will continue to follow to continue discussion re: substance abuse treatment and any community resources that patient might be interested in.   Employment status:  [name] information:  Managed Care PT Recommendations:  Not assessed at this time Information / Referral to community resources:  Outpatient Substance Abuse Treatment Options  Patient/Family's  Response to care:  Dtrs requested to be part of treatment plan. Patient agreeable to family involvement and reports he is lucky to have people that care about him. Patient thanked CSW for concern re: SA but is not interested in resources at this time.  Patient/Family's Understanding of and Emotional Response to Diagnosis, Current Treatment, and Prognosis:  Patient feels that hospitalization is "not a big  deal" and that he will recover quickly and independently. Patient wants to get sober because he does not want to die at a young age and have dtrs feel pain and grief that he had after losing his father.  Emotional Assessment Appearance:  Appears older than stated age, Disheveled Attitude/Demeanor/Rapport:  Other Affect (typically observed):  Appropriate Orientation:  Oriented to Self, Oriented to Place, Oriented to  Time, Oriented to Situation Alcohol / Substance use:  Not Applicable Psych involvement (Current and /or in the community):  No (Comment)  Discharge Needs  Concerns to be addressed:  Compliance Issues Concerns, Home Safety Concerns, Substance Abuse Concerns Readmission within the last 30 days:  Yes Current discharge risk:  Lives alone, Substance Abuse Barriers to Discharge:  Continued Medical Work up   [employer], [place] 01/15/2015, 3:43 PM [phone]

## 2015-01-15 NOTE — Progress Notes (Signed)
[facility] PROGRESS NOTE  Darrell Day ZOX:[id number] DOB: 09-10-1960 DOA: 01/14/2015 PCP: No primary care provider on file.  Assessment/Plan: Principal Problem:   Hypokalemia: most likely due to poor oral intake - improved after replacement - replace orally - magnesium within normal limits  Active Problems:   Hyperglycemia - consult diabetic coordinator - continue SSI - awaiting hgba1c results    Nicotine dependence - nicotine patch    Hyponatremia - improving and most likely due to poor oral solute intake    Alcohol dependence - Continue CIWA protocol  Code Status: full Family Communication:  No family at bedside Disposition Plan: pending improvement in condition for today transfer to floor   Consultants:  None  Procedures:  None  Antibiotics:  None  HPI/Subjective: Patient has no new complaints. No acute issues overnight. States he feels better  Objective: Filed Vitals:   01/15/15 1005  BP: 92/77  Pulse: 79  Temp:   Resp: 19    Intake/Output Summary (Last 24 hours) at 01/15/15 1036 Last data filed at 01/15/15 1000  Gross per 24 hour  Intake 3243.33 ml  Output   1150 ml  Net 2093.33 ml   Filed Weights   01/14/15 1300  Weight: 51.9 kg (114 lb 6.7 oz)    Exam:   General:  Patient in no acute distress, alert and awake  Cardiovascular: Regular rate and rhythm, no murmurs or rubs  Respiratory: Clear to auscultation bilaterally, no wheezes, no increased work of breathing, equal chest rise  Abdomen: Soft, nondistended, nontender, no guarding  Musculoskeletal: No cyanosis or clubbing   Data Reviewed: Basic Metabolic Panel:  Recent Labs Lab 01/14/15 1003 01/14/15 1818 01/15/15 0353  NA 126* 129* 131*  K <2.0* 3.2* 3.1*  CL <65* 74* 80*  CO2 44* 40* 44*  GLUCOSE 431* 438* 178*  BUN CREATININE 0.58* 0.63 0.55*  CALCIUM 8.4* 7.9* 7.7*  MG 1.6*  --  1.7  PHOS 3.1  --   --    Liver Function Tests:  Recent  Labs Lab 01/14/15 1003 01/15/15 0353  AST 17 13*  ALT 13* 9*  ALKPHOS 128* 114  BILITOT 1.0 0.9  PROT 6.0* 5.3*  ALBUMIN 3.0* 2.6*   No results for input(s): LIPASE, AMYLASE in the last 168 hours. No results for input(s): AMMONIA in the last 168 hours. CBC:  Recent Labs Lab 01/14/15 1003 01/15/15 0353  WBC 4.5 5.5  NEUTROABS 2.7  --   HGB 13.7 12.1*  HCT 38.5* 35.9*  MCV 91.7 94.7  PLT 211 169   Cardiac Enzymes: No results for input(s): CKTOTAL, CKMB, CKMBINDEX, TROPONINI in the last 168 hours. BNP (last 3 results) No results for input(s): BNP in the last 8760 hours.  ProBNP (last 3 results) No results for input(s): PROBNP in the last 8760 hours.  CBG:  Recent Labs Lab 01/14/15 1736 01/14/15 1934 01/15/15 0158 01/15/15 0734  GLUCAP 479* 427* 144* 278*    Recent Results (from the past 240 hour(s))  MRSA PCR Screening     Status: None   Collection Time: 01/14/15  1:32 PM  Result Value Ref Range Status   MRSA by PCR NEGATIVE NEGATIVE Final    Comment:        The GeneXpert MRSA Assay (FDA approved for NASAL specimens only), is one component of a comprehensive MRSA colonization surveillance program. It is not intended to diagnose MRSA infection nor to guide or monitor treatment for MRSA infections.  Studies: Dg Chest 2 View  01/14/2015   CLINICAL DATA:  Weight loss, weakness.  EXAM: CHEST  2 VIEW  COMPARISON:  None.  FINDINGS: The heart size and mediastinal contours are within normal limits. Both lungs are clear. No pneumothorax or pleural effusion is noted. The visualized skeletal structures are unremarkable.  IMPRESSION: No active cardiopulmonary disease.   Electronically Signed   By: Lupita RaiderJames  Green Jr, M.D.   On: 01/14/2015 11:02    Scheduled Meds: . folic acid  1 mg Oral Daily  . heparin  5,000 Units Subcutaneous 3 times per day  . insulin aspart  0-15 Units Subcutaneous TID WC  . insulin aspart  0-5 Units Subcutaneous QHS  . LORazepam  0-4 mg  Oral Q6H   Followed by  . [START ON 01/16/2015] LORazepam  0-4 mg Oral Q12H  . multivitamin with minerals  1 tablet Oral Daily  . nicotine  14 mg Transdermal Daily  . sodium chloride  3 mL Intravenous Q12H  . thiamine  100 mg Oral Daily   Or  . thiamine  100 mg Intravenous Daily   Continuous Infusions: . lactated ringers with kcl 100 mL/hr at 01/15/15 0012    Time spent: > 35 minutes    [name], [name]  [facility] Pager [phone] If 7PM-7AM, please contact night-coverage at [email or web address], password [email or web address] 01/15/2015, 10:36 AM  LOS: 1 day

## 2015-01-16 DIAGNOSIS — E43 Unspecified severe protein-calorie malnutrition: Secondary | ICD-10-CM | POA: Diagnosis present

## 2015-01-16 LAB — GLUCOSE, CAPILLARY
Glucose-Capillary: 267 mg/dL — ABNORMAL HIGH (ref 65–99)
Glucose-Capillary: 298 mg/dL — ABNORMAL HIGH (ref 65–99)
Glucose-Capillary: 173 mg/dL — ABNORMAL HIGH (ref 65–99)
Glucose-Capillary: 190 mg/dL — ABNORMAL HIGH (ref 65–99)

## 2015-01-16 LAB — CLOSTRIDIUM DIFFICILE BY PCR: Toxigenic C. Difficile by PCR: NEGATIVE

## 2015-01-16 MED ORDER — INSULIN GLARGINE 100 UNIT/ML ~~LOC~~ SOLN
5.0000 [IU] | Freq: Two times a day (BID) | SUBCUTANEOUS | Status: DC
  Administered 2015-01-16 – 2015-01-17 (×3): 5 [IU] via SUBCUTANEOUS
  Filled 2015-01-16 (×4): qty 0.05

## 2015-01-16 MED ORDER — SODIUM CHLORIDE 0.9 % IV SOLN
INTRAVENOUS | Status: DC
  Administered 2015-01-16: 15:00:00 via INTRAVENOUS

## 2015-01-16 MED ORDER — POTASSIUM CHLORIDE CRYS ER 20 MEQ PO TBCR
40.0000 meq | EXTENDED_RELEASE_TABLET | Freq: Once | ORAL | Status: AC
  Administered 2015-01-16: 40 meq via ORAL
  Filled 2015-01-16: qty 2

## 2015-01-16 NOTE — Progress Notes (Signed)
Clinical Social Work  CSW went to room to meet with patient. Patient reports he is not feeling well today and asked CSW to follow up tomorrow. CSW explained that dtr called wanting an update on patient and patient remains agreeable to family involvement.  CSW spoke with dtr [name] via phone who reports that patient does not care for himself. 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 Dtr is concerned because patient will lose his job shortly and will be unable to afford his rent. Dtr has offered patient to live with her in the past and he has refused. Dtr is hopeful that he will change his mind after he loses his job.  Dtr reports that patient's house is not clean and is not a safe environment. CSW explained that patient has the right to make his own decisions about where he wants to live. Dtr wanted to speak with someone about his living situation and CSW referred her to Adult Protective Services for further assistance. CSW explained that patient could explain circumstances to determine if they could assist with cleaning house. Dtr understanding that patient will return home if that is his choice.  CSW will continue to follow.  Darrell Day, Kentucky[place]CS[place] [phone]

## 2015-01-16 NOTE — Progress Notes (Signed)
[facility] PROGRESS NOTE  Darrell MorasJeffrey Day ZOX:[id number] DOB: [birth year] DOA: 01/14/2015 PCP: No primary care provider on file.   Brief narrative: 54 year old with history of alcohol dependence and nicotine dependence. presenting with profound hypokalemia, hyponatremia, and new onset diabetes.  Assessment/Plan: Principal Problem:   Hypokalemia: most likely due to poor oral intake - improved after replacement. Will replace again today - replace orally and reassess next am. - magnesium within normal limits  Active Problems:   Hyperglycemia: Still not well controlled - consulted diabetic coordinator - continue SSI - hgba1c: 12, as such patient will require insulin on discharge. We'll add Lantus given poor control     Nicotine dependence - nicotine patch    Hyponatremia - improving and most likely due to poor oral solute intake    Alcohol dependence - Continue CIWA protocol  Code Status: full Family Communication:  No family at bedside Disposition Plan: With improvement of blood sugars may consider discharge in the next one or 2 days, will obtain physical therapy evaluation    Consultants:  None  Procedures:  None  Antibiotics:  None  HPI/Subjective: Patient has no new complaints. No acute issues overnight.   Objective: Filed Vitals:   01/16/15 1600  BP: 111/75  Pulse: 113  Temp: 98.4 F (36.9 C)  Resp: 16    Intake/Output Summary (Last 24 hours) at 01/16/15 1838 Last data filed at 01/16/15 1440  Gross per 24 hour  Intake   1040 ml  Output    350 ml  Net    690 ml   Filed Weights   01/14/15 1300  Weight: 51.9 kg (114 lb 6.7 oz)    Exam:   General:  Patient in no acute distress, alert and awake  Cardiovascular: Regular rate and rhythm, no murmurs or rubs  Respiratory: Clear to auscultation bilaterally, no wheezes, no increased work of breathing, equal chest rise  Abdomen: Soft, nondistended, nontender, no guarding  Musculoskeletal: No  cyanosis or clubbing   Data Reviewed: Basic Metabolic Panel:  Recent Labs Lab 01/14/15 1003 01/14/15 1818 01/15/15 0353  NA 126* 129* 131*  K <2.0* 3.2* 3.1*  CL <65* 74* 80*  CO2 44* 40* 44*  GLUCOSE 431* 438* 178*  BUN 7 9 11   CREATININE 0.58* 0.63 0.55*  CALCIUM 8.4* 7.9* 7.7*  MG 1.6*  --  1.7  PHOS 3.1  --   --    Liver Function Tests:  Recent Labs Lab 01/14/15 1003 01/15/15 0353  AST 17 13*  ALT 13* 9*  ALKPHOS 128* 114  BILITOT 1.0 0.9  PROT 6.0* 5.3*  ALBUMIN 3.0* 2.6*   No results for input(s): LIPASE, AMYLASE in the last 168 hours. No results for input(s): AMMONIA in the last 168 hours. CBC:  Recent Labs Lab 01/14/15 1003 01/15/15 0353  WBC 4.5 5.5  NEUTROABS 2.7  --   HGB 13.7 12.1*  HCT 38.5* 35.9*  MCV 91.7 94.7  PLT 211 169   Cardiac Enzymes: No results for input(s): CKTOTAL, CKMB, CKMBINDEX, TROPONINI in the last 168 hours. BNP (last 3 results) No results for input(s): BNP in the last 8760 hours.  ProBNP (last 3 results) No results for input(s): PROBNP in the last 8760 hours.  CBG:  Recent Labs Lab 01/15/15 1646 01/15/15 2143 01/16/15 0758 01/16/15 1258 01/16/15 1609  GLUCAP 178* 373* 267* 298* 173*    Recent Results (from the past 240 hour(s))  MRSA PCR Screening     Status: None   Collection Time: 01/14/15  1:32 PM  Result Value Ref Range Status   MRSA by PCR NEGATIVE NEGATIVE Final    Comment:        The GeneXpert MRSA Assay (FDA approved for NASAL specimens only), is one component of a comprehensive MRSA colonization surveillance program. It is not intended to diagnose MRSA infection nor to guide or monitor treatment for MRSA infections.   Clostridium Difficile by PCR (not at [facility])     Status: None   Collection Time: 01/16/15  5:43 AM  Result Value Ref Range Status   C difficile by pcr NEGATIVE NEGATIVE Final     Studies: No results found.  Scheduled Meds: . feeding supplement (PRO-STAT 64)  30 mL Oral  BID BM  . folic acid  1 mg Oral Daily  . heparin  5,000 Units Subcutaneous 3 times per day  . insulin aspart  0-15 Units Subcutaneous TID WC  . insulin aspart  0-5 Units Subcutaneous QHS  . insulin glargine  5 Units Subcutaneous BID  . LORazepam  0-4 mg Oral Q12H  . multivitamin with minerals  1 tablet Oral Daily  . nicotine  14 mg Transdermal Daily  . sodium chloride  3 mL Intravenous Q12H  . thiamine  100 mg Oral Daily   Or  . thiamine  100 mg Intravenous Daily   Continuous Infusions: . sodium chloride 75 mL/hr at 01/16/15 1520    Time spent: > 35 minutes    Penny Pia  [facility] Pager [phone] If 7PM-7AM, please contact night-coverage at [email or web address], password [facility] 01/16/2015, 6:38 PM  LOS: 2 days

## 2015-01-16 NOTE — Care Management Note (Signed)
Case Management Note  Patient Details  Name: Darrell Day MRN: [name] Date of Birth: 1961/03/26  Subjective/Objective:  54 y/o m admitted w/hypokalemia.From home.   Noted CSW following.               Action/Plan:d/c plan home.   Expected Discharge Date:   (unknown)               Expected Discharge Plan:  Home/Self Care  In-House Referral:     Discharge planning Services  CM Consult  Post Acute Care Choice:    Choice offered to:     DME Arranged:    DME Agency:     HH Arranged:    HH Agency:     Status of Service:  In process, will continue to follow  Medicare Important Message Given:    Date Medicare IM Given:    Medicare IM give by:    Date Additional Medicare IM Given:    Additional Medicare Important Message give by:     If discussed at Long Length of Stay Meetings, dates discussed:    Additional Comments:  Lanier ClamMahabir, Tamana Hatfield, RN 01/16/2015, 4:08 PM

## 2015-01-16 NOTE — Clinical Documentation Improvement (Signed)
Supporting Information: Workup with hypokalemia, likely due to malnourishment per 6/29 ED notes.  Severe Malnutrition related to social / environmental circumstances as evidenced by severe depletion of muscle mass, severe depletion of body fat, underweight per Registered Dietician's 6/29 evaluation.    Possible Clinical Condition: . Severity: --Mild Malnutrition (first degree) --Moderate Malnutrition (second degree) --Severe Malnutrition (third degree) . Avoid documenting a range of severity, such as "moderate to severe" . Form: --Kwashiorkor (rarely seen in the [place] --Marasmus --Marasmic kwashiorkor --Other . Document any associated diagnoses/conditions    Thank Gabriel CirriYou, Maite Burlison Mathews-Bethea,RN,BSN,Clinical Documentation Specialist [phone] [email or web address]

## 2015-01-17 DIAGNOSIS — E43 Unspecified severe protein-calorie malnutrition: Secondary | ICD-10-CM

## 2015-01-17 DIAGNOSIS — R739 Hyperglycemia, unspecified: Secondary | ICD-10-CM

## 2015-01-17 DIAGNOSIS — E871 Hypo-osmolality and hyponatremia: Secondary | ICD-10-CM

## 2015-01-17 DIAGNOSIS — E119 Type 2 diabetes mellitus without complications: Secondary | ICD-10-CM

## 2015-01-17 DIAGNOSIS — F17210 Nicotine dependence, cigarettes, uncomplicated: Secondary | ICD-10-CM

## 2015-01-17 DIAGNOSIS — E876 Hypokalemia: Secondary | ICD-10-CM

## 2015-01-17 DIAGNOSIS — F10230 Alcohol dependence with withdrawal, uncomplicated: Secondary | ICD-10-CM

## 2015-01-17 LAB — GLUCOSE, CAPILLARY
GLUCOSE-CAPILLARY: 178 mg/dL — AB (ref 65–99)
GLUCOSE-CAPILLARY: 72 mg/dL (ref 65–99)
Glucose-Capillary: 185 mg/dL — ABNORMAL HIGH (ref 65–99)
Glucose-Capillary: 124 mg/dL — ABNORMAL HIGH (ref 65–99)

## 2015-01-17 LAB — BASIC METABOLIC PANEL
Anion gap: 7 (ref 5–15)
BUN: 19 mg/dL (ref 6–20)
CO2: 30 mmol/L (ref 22–32)
Calcium: 8.5 mg/dL — ABNORMAL LOW (ref 8.9–10.3)
Chloride: 95 mmol/L — ABNORMAL LOW (ref 101–111)
Creatinine, Ser: 0.56 mg/dL — ABNORMAL LOW (ref 0.61–1.24)
GFR calc Af Amer: >60 mL/min (ref >60)
GLUCOSE: 198 mg/dL — AB (ref 65–99)
Potassium: 4 mmol/L (ref 3.5–5.1)
Sodium: 132 mmol/L — ABNORMAL LOW (ref 135–145)

## 2015-01-17 MED ORDER — ENOXAPARIN SODIUM 40 MG/0.4ML ~~LOC~~ SOLN
40.0000 mg | SUBCUTANEOUS | Status: DC
  Administered 2015-01-17 – 2015-01-18 (×2): 40 mg via SUBCUTANEOUS
  Filled 2015-01-17 (×3): qty 0.4

## 2015-01-17 MED ORDER — LIVING WELL WITH DIABETES BOOK
Freq: Once | Status: AC
  Administered 2015-01-17: 15:00:00
  Filled 2015-01-17: qty 1

## 2015-01-17 MED ORDER — INSULIN STARTER KIT- SYRINGES (ENGLISH)
1.0000 | Freq: Once | Status: DC
  Filled 2015-01-17: qty 1

## 2015-01-17 MED ORDER — INSULIN STARTER KIT- SYRINGES (ENGLISH)
1.0000 | Freq: Once | Status: AC
  Administered 2015-01-18: 1

## 2015-01-17 NOTE — Progress Notes (Signed)
Progress Note   Darrell Day ZOX:[id number] DOB: Jan 05, 1961 DOA: 01/14/2015 PCP: No primary care provider on file.   Brief Narrative:   Darrell MorasJeffrey Day is an 54 y.o. male with a PMH of alcoholism who was admitted on 01/14/15 with a chief complaint of dizziness and weakness, found to have profound hypokalemia, hyperglycemia and hyponatremia on presentation. Hemoglobin A1c subsequently found to be 12.4, consistent with diabetes.  Assessment/Plan:   Principal Problem:   New onset type 2 diabetes mellitus / hyperglycemia - Continue Lantus and moderate scale SSI. Add 3 units of NovoLog for meal coverage. - RN to begin teaching the patient had a self administer insulin. - We'll need a HH nurse at discharge to ensure smooth transition to home, as patient not currently under the care of a physician.  Active Problems:   Hypokalemia - Potassium WNL after vigorous supplementation.    Nicotine dependence - Continue nicotine patch.    Hyponatremia - Likely a combination of potomania and hyperglycemia. - Improving with abstinence from alcohol and correction of blood glucose is.    Alcohol dependence - CIWA score 0-1. - Continue detoxification with Ativan per CIWA protocol. - Continue thiamine and folic acid supplementation. - Social worker involved in providing resources to the patient and his family. Refuses placement.    Protein-calorie malnutrition, severe - Seen by dietitian 01/15/15. Continue protein supplementation.    DVT Prophylaxis - Lovenox ordered.  Family Communication: No family at the bedside. Disposition Plan: Home when glycemic control improved, possibly the next 1-2 days. Code Status:     Code Status Orders        Start     Ordered   01/14/15 1326  Full code   Continuous     01/14/15 1326        IV Access:    Peripheral IV   Procedures and diagnostic studies:   Dg Chest 2 View  01/14/2015   CLINICAL DATA:  Weight loss, weakness.  EXAM: CHEST   2 VIEW  COMPARISON:  None.  FINDINGS: The heart size and mediastinal contours are within normal limits. Both lungs are clear. No pneumothorax or pleural effusion is noted. The visualized skeletal structures are unremarkable.  IMPRESSION: No active cardiopulmonary disease.   Electronically Signed   By: Lupita RaiderJames  Green Jr, M.D.   On: 01/14/2015 11:02     Medical Consultants:    None.  Anti-Infectives:    None.  Subjective:    Darrell MorasJeffrey Day had some nausea and vomiting as well as diarrhea yesterday, but denies any today. Denies pain or shortness of breath. Appetite is improving. Still feels weak.  Objective:    Filed Vitals:   01/16/15 0558 01/16/15 1600 01/16/15 2345 01/17/15 0455  BP: 131/86 111/75 123/80 113/74  Pulse: 99 113 104 97  Temp: 98 F (36.7 C) 98.4 F (36.9 C) 98.5 F (36.9 C) 99.4 F (37.4 C)  TempSrc: Oral Oral Oral Oral  Resp: 18 16 18 16   Height:      Weight:      SpO2: 95% 94% 94% 94%    Intake/Output Summary (Last 24 hours) at 01/17/15 [id number] Last data filed at 01/16/15 1440  Gross per 24 hour  Intake   1040 ml  Output      0 ml  Net   1040 ml    Exam: Gen:  NAD, thin, appears older than stated age Cardiovascular:  RRR, No M/R/G Respiratory:  Lungs CTAB Gastrointestinal:  Abdomen soft, NT/ND, + BS  Extremities:  No C/E/C   Data Reviewed:    Labs: Basic Metabolic Panel:  Recent Labs Lab 01/14/15 1003 01/14/15 1818 01/15/15 0353 01/17/15 0605  NA 126* 129* 131* 132*  K <2.0* 3.2* 3.1* 4.0  CL <65* 74* 80* 95*  CO2 44* 40* 44* 30  GLUCOSE 431* 438* 178* 198*  BUN CREATININE 0.58* 0.63 0.55* 0.56*  CALCIUM 8.4* 7.9* 7.7* 8.5*  MG 1.6*  --  1.7  --   PHOS 3.1  --   --   --    GFR Estimated Creatinine Clearance: 78.4 mL/min (by C-G formula based on Cr of 0.56). Liver Function Tests:  Recent Labs Lab 01/14/15 1003 01/15/15 0353  AST 17 13*  ALT 13* 9*  ALKPHOS 128* 114  BILITOT 1.0 0.9  PROT 6.0* 5.3*  ALBUMIN  3.0* 2.6*   CBC:  Recent Labs Lab 01/14/15 1003 01/15/15 0353  WBC 4.5 5.5  NEUTROABS 2.7  --   HGB 13.7 12.1*  HCT 38.5* 35.9*  MCV 91.7 94.7  PLT 211 169   CBG:  Recent Labs Lab 01/16/15 0758 01/16/15 1258 01/16/15 1609 01/16/15 2116 01/17/15 0738  GLUCAP 267* 298* 173* 190* 185*   Hgb A1c:  Recent Labs  01/14/15 1355  HGBA1C 12.4*   Microbiology Recent Results (from the past 240 hour(s))  MRSA PCR Screening     Status: None   Collection Time: 01/14/15  1:32 PM  Result Value Ref Range Status   MRSA by PCR NEGATIVE NEGATIVE Final    Comment:        The GeneXpert MRSA Assay (FDA approved for NASAL specimens only), is one component of a comprehensive MRSA colonization surveillance program. It is not intended to diagnose MRSA infection nor to guide or monitor treatment for MRSA infections.   Clostridium Difficile by PCR (not at [facility])     Status: None   Collection Time: 01/16/15  5:43 AM  Result Value Ref Range Status   C difficile by pcr NEGATIVE NEGATIVE Final     Medications:   . feeding supplement (PRO-STAT 64)  30 mL Oral BID BM  . folic acid  1 mg Oral Daily  . heparin  5,000 Units Subcutaneous 3 times per day  . insulin aspart  0-15 Units Subcutaneous TID WC  . insulin aspart  0-5 Units Subcutaneous QHS  . insulin glargine  5 Units Subcutaneous BID  . LORazepam  0-4 mg Oral Q12H  . multivitamin with minerals  1 tablet Oral Daily  . nicotine  14 mg Transdermal Daily  . sodium chloride  3 mL Intravenous Q12H  . thiamine  100 mg Oral Daily   Continuous Infusions:   Time spent: 25 minutes.   LOS: 3 days   RAMA,CHRISTINA  [facility] Pager [phone]. If unable to reach me by pager, please call my cell phone at [phone].  *Please refer to [email or web address], password [email or web address] to get updated schedule on who will round on this patient, as hospitalists switch teams weekly. If 7PM-7AM, please contact night-coverage at [email or web address], password [email or web address]  for any overnight needs.  01/17/2015, 8:33 AM

## 2015-01-17 NOTE — Evaluation (Signed)
Physical Therapy One Time Evaluation Patient Details Name: Daisuke Bailey MRN: [id number] DOB: [birth year] Today's Date: 01/17/2015   History of Present Illness  54 year old with history of alcohol dependence and nicotine dependence. presenting with profound hypokalemia, hyponatremia, and new onset diabetes  Clinical Impression  Patient evaluated by Physical Therapy with no further acute PT needs identified. All education has been completed and the patient has no further questions.  Pt mobilizing well without difficulty.  No follow-up Physical Therapy or equipment needs. PT is signing off. Thank you for this referral.     Follow Up Recommendations No PT follow up    Equipment Recommendations  None recommended by PT    Recommendations for Other Services       Precautions / Restrictions Precautions Precautions: None      Mobility  Bed Mobility Overal bed mobility: Modified Independent                Transfers Overall transfer level: Modified independent                  Ambulation/Gait Ambulation/Gait assistance: Supervision;Modified independent (Device/Increase time) Ambulation Distance (Feet): 280 Feet Assistive device: Straight cane Gait Pattern/deviations: WFL(Within Functional Limits)     General Gait Details: steady gait, appropriate use of SPC, no LOB, denies dizziness  Stairs            Wheelchair Mobility    Modified Rankin (Stroke Patients Only)       Balance                                             Pertinent Vitals/Pain Pain Assessment: No/denies pain    Home Living Family/patient expects to be discharged to:: Private residence Living Arrangements: Alone           Home Layout: Two level Home Equipment: Cane - single point;Crutches Additional Comments: pt reports 2 flights to bathroom, uses hand rail    Prior Function Level of Independence: Independent with assistive device(s)         Comments:  has been using SPC since knee scope in May per pt     Hand Dominance        Extremity/Trunk Assessment   Upper Extremity Assessment: Overall WFL for tasks assessed           Lower Extremity Assessment: Overall WFL for tasks assessed      Cervical / Trunk Assessment: Normal  Communication   Communication: No difficulties  Cognition Arousal/Alertness: Awake/alert Behavior During Therapy: WFL for tasks assessed/performed Overall Cognitive Status: Within Functional Limits for tasks assessed                      General Comments      Exercises        Assessment/Plan    PT Assessment Patent does not need any further PT services  PT Diagnosis     PT Problem List    PT Treatment Interventions     PT Goals (Current goals can be found in the Care Plan section) Acute Rehab PT Goals PT Goal Formulation: All assessment and education complete, DC therapy    Frequency     Barriers to discharge        Co-evaluation               End of Session   Activity Tolerance: Patient  tolerated treatment well Patient left:  (in bathroom) Nurse Communication: Mobility status         Time: 1352-1400 PT Time Calculation (min) (ACUTE ONLY): 8 min   Charges:   PT Evaluation $Initial PT Evaluation Tier I: 1 Procedure     PT G Codes:        Lilygrace Rodick,KATHrine E 01/17/2015, 2:39 PM Zenovia JarredKati Demaris Leavell, PT, DPT 01/17/2015 Pager: [phone]

## 2015-01-17 NOTE — Progress Notes (Signed)
Inpatient Diabetes Program Recommendations  AACE/ADA: New Consensus Statement on Inpatient Glycemic Control (2013)  Target Ranges:  Prepandial:   less than 140 mg/dL      Peak postprandial:   less than 180 mg/dL (1-2 hours)      Critically ill patients:  140 - 180 mg/dL   Reason for Visit: New-onset DM  Spoke with pt at length regarding his new diagnosis of DM. With regards to insulin administration, pt states "I'll do what I have to do." Discussed blood glucose monitoring, hypoglycemia s/s and treatment, and importance of f/u with PCP for management of DM.   Results for Darrell Day, Darrell Day (MRN [id number]) as of 01/17/2015 13:52  Ref. Range 01/16/2015 16:09 01/16/2015 21:16 01/17/2015 07:38 01/17/2015 12:08  Glucose-Capillary Latest Ref Range: 65-99 mg/dL 782173 (H) 956190 (H) 213185 (H) 178 (H)   Blood sugars < 200 mg/dL x 24 hours. Much improved.  Recommendation: After speaking with pt about new diagnosis of DM, I recommend pt obtain glucose meter and check blood sugars 2x/day. Take logbook to MD to adjust or add diabetes meds. Since pt has not seen doctor in over 20 years, and hx of ETOH abuse (drinks a fifth of liquor daily), this Coordinator recommends OHAs and monitoring blood sugars at discharge. After pt has checked blood sugars on consistent basis and f/u with PCP, pt can be reassessed for insulin needs by PCP.  Discussed with RN. Thank you. Ailene Ardshonda Trajan [place], RD, LDN, CDE Inpatient Diabetes Coordinator [phone]

## 2015-01-17 NOTE — Progress Notes (Signed)
Clinical Social Work  CSW met with patient at bedside to discuss DC plans and SA treatment options. Patient reports he did well with PT and no follow up needed. Patient reports that he has a granddtr that is about to be born and he wants to be sober. Patient is a new diabetic and reports he knows that he cannot continue to drink. Patient reports he is not ready for formal treatment but would consider AA meetings to meet others that could offer support. CSW provided AA meetings and agreeable to continue to follow for support.  [place], [place] [phone]

## 2015-01-18 LAB — GLUCOSE, CAPILLARY
GLUCOSE-CAPILLARY: 53 mg/dL — AB (ref 65–99)
GLUCOSE-CAPILLARY: 254 mg/dL — AB (ref 65–99)
Glucose-Capillary: 75 mg/dL (ref 65–99)
Glucose-Capillary: 175 mg/dL — ABNORMAL HIGH (ref 65–99)
Glucose-Capillary: 179 mg/dL — ABNORMAL HIGH (ref 65–99)

## 2015-01-18 MED ORDER — ALUM & MAG HYDROXIDE-SIMETH 200-200-20 MG/5ML PO SUSP
30.0000 mL | ORAL | Status: DC | PRN
  Administered 2015-01-18 (×3): 30 mL via ORAL
  Filled 2015-01-18 (×3): qty 30

## 2015-01-18 NOTE — Progress Notes (Signed)
Progress Note   Avion Kutzer YYT:[id number] DOB: 1961/02/28 DOA: 01/14/2015 PCP: No primary care provider on file.   Brief Narrative:   Darrell Day is an 54 y.o. male with a PMH of alcoholism who was admitted on 01/14/15 with a chief complaint of dizziness and weakness, found to have profound hypokalemia, hyperglycemia and hyponatremia on presentation. Hemoglobin A1c subsequently found to be 12.4, consistent with diabetes.  Assessment/Plan:   Principal Problem:   New onset type 2 diabetes mellitus / hyperglycemia - Currently receiving Lantus and moderate scale SSI with 3 units of NovoLog for meal coverage.  - Had a low of 53 this morning. Discontinue Lantus. - RN to begin teaching the patient had a self administer insulin. Patient has been self administering his own insulin. - Will need a HH RN at D/C to ensure smooth transition to home, as patient not currently under the care of a physician.  Active Problems:   Hypokalemia - Potassium WNL after vigorous supplementation.    Nicotine dependence - Continue nicotine patch.    Hyponatremia - Likely a combination of potomania and hyperglycemia. - Improving with abstinence from alcohol and correction of blood glucose is.    Alcohol dependence - CIWA score 0-1. - Continue detoxification with Ativan per CIWA protocol. - Continue thiamine and folic acid supplementation. - Social worker involved in providing resources to the patient and his family. Refuses placement.    Protein-calorie malnutrition, severe - Seen by dietitian 01/15/15. Continue protein supplementation.    DVT Prophylaxis - Lovenox ordered.  Family Communication: No family at the bedside. Disposition Plan: Anticipated discharge 01/19/15 his glycemic control reasonable and no further hypoglycemic episodes. Code Status:     Code Status Orders        Start     Ordered   01/14/15 1326  Full code   Continuous     01/14/15 1326        IV Access:     Peripheral IV   Procedures and diagnostic studies:   Dg Chest 2 View  01/14/2015   CLINICAL DATA:  Weight loss, weakness.  EXAM: CHEST  2 VIEW  COMPARISON:  None.  FINDINGS: The heart size and mediastinal contours are within normal limits. Both lungs are clear. No pneumothorax or pleural effusion is noted. The visualized skeletal structures are unremarkable.  IMPRESSION: No active cardiopulmonary disease.   Electronically Signed   By: Marijo Conception, M.D.   On: 01/14/2015 11:02     Medical Consultants:    None.  Anti-Infectives:    None.  Subjective:   Darrell Day denies any further nausea/vomiting. No diarrhea. Reports that he is feeling more comfortable with insulin administration and nursing staff reports that he is able to calculate his insulin dose and self administer insulin via syringe.  Objective:    Filed Vitals:   01/17/15 0455 01/17/15 1439 01/17/15 2148 01/18/15 0630  BP: 113/74 117/79 95/69 128/85  Pulse: 97 87 76 78  Temp: 99.4 F (37.4 C) 98.1 F (36.7 C) 98.8 F (37.1 C) 97.9 F (36.6 C)  TempSrc: Oral Oral Oral Oral  Resp: _0 Height:      Weight:      SpO2: 94% 97% 100% 97%    Intake/Output Summary (Last 24 hours) at 01/18/15 0809 Last data filed at 01/18/15 0807  Gross per 24 hour  Intake    480 ml  Output      0 ml  Net  480 ml    Exam: Gen:  NAD, thin, appears older than stated age Cardiovascular:  RRR, No M/R/G Respiratory:  Lungs CTAB Gastrointestinal:  Abdomen soft, NT/ND, + BS Extremities:  No C/E/C   Data Reviewed:    Labs: Basic Metabolic Panel:  Recent Labs Lab 01/14/15 1003 01/14/15 1818 01/15/15 0353 01/17/15 0605  NA 126* 129* 131* 132*  K <2.0* 3.2* 3.1* 4.0  CL <65* 74* 80* 95*  CO2 44* 40* 44* 30  GLUCOSE 431* 438* 178* 198*  BUN _0 CREATININE 0.58* 0.63 0.55* 0.56*  CALCIUM 8.4* 7.9* 7.7* 8.5*  MG 1.6*  --  1.7  --   PHOS 3.1  --   --   --    GFR Estimated Creatinine  Clearance: 78.4 mL/min (by C-G formula based on Cr of 0.56). Liver Function Tests:  Recent Labs Lab 01/14/15 1003 01/15/15 0353  AST 17 13*  ALT 13* 9*  ALKPHOS 128* 114  BILITOT 1.0 0.9  PROT 6.0* 5.3*  ALBUMIN 3.0* 2.6*   CBC:  Recent Labs Lab 01/14/15 1003 01/15/15 0353  WBC 4.5 5.5  NEUTROABS 2.7  --   HGB 13.7 12.1*  HCT 38.5* 35.9*  MCV 91.7 94.7  PLT 211 169   CBG:  Recent Labs Lab 01/17/15 1208 01/17/15 1648 01/17/15 2142 01/18/15 0714 01/18/15 0746  GLUCAP 178* 124* 72 53* 75   Hgb A1c: No results for input(s): HGBA1C in the last 72 hours. Microbiology Recent Results (from the past 240 hour(s))  MRSA PCR Screening     Status: None   Collection Time: 01/14/15  1:32 PM  Result Value Ref Range Status   MRSA by PCR NEGATIVE NEGATIVE Final    Comment:        The GeneXpert MRSA Assay (FDA approved for NASAL specimens only), is one component of a comprehensive MRSA colonization surveillance program. It is not intended to diagnose MRSA infection nor to guide or monitor treatment for MRSA infections.   Clostridium Difficile by PCR (not at [facility])     Status: None   Collection Time: 01/16/15  5:43 AM  Result Value Ref Range Status   C difficile by pcr NEGATIVE NEGATIVE Final     Medications:   . enoxaparin (LOVENOX) injection  40 mg Subcutaneous Q24H  . feeding supplement (PRO-STAT 64)  30 mL Oral BID BM  . folic acid  1 mg Oral Daily  . insulin aspart  0-15 Units Subcutaneous TID WC  . insulin aspart  0-5 Units Subcutaneous QHS  . insulin glargine  5 Units Subcutaneous BID  . insulin starter kit- syringes  1 kit Other Once  . LORazepam  0-4 mg Oral Q12H  . multivitamin with minerals  1 tablet Oral Daily  . nicotine  14 mg Transdermal Daily  . sodium chloride  3 mL Intravenous Q12H  . thiamine  100 mg Oral Daily   Continuous Infusions:   Time spent: 25 minutes.   LOS: 4 days   [facility] Pager [phone]. If  unable to reach me by pager, please call my cell phone at [phone].  *Please refer to [email or web address], password [email or web address] to get updated schedule on who will round on this patient, as hospitalists switch teams weekly. If 7PM-7AM, please contact night-coverage at [email or web address], password [email or web address] for any overnight needs.  01/18/2015, 8:09 AM

## 2015-01-19 LAB — GLUCOSE, CAPILLARY
GLUCOSE-CAPILLARY: 307 mg/dL — AB (ref 65–99)
Glucose-Capillary: 227 mg/dL — ABNORMAL HIGH (ref 65–99)

## 2015-01-19 MED ORDER — INSULIN ASPART 100 UNIT/ML ~~LOC~~ SOLN: 4.0000 [IU] | Freq: Three times a day (TID) | SUBCUTANEOUS | Status: AC

## 2015-01-19 MED ORDER — THIAMINE HCL 100 MG PO TABS: 100.0000 mg | ORAL_TABLET | Freq: Every day | ORAL | Status: AC

## 2015-01-19 MED ORDER — PANTOPRAZOLE SODIUM 20 MG PO TBEC: 20.0000 mg | DELAYED_RELEASE_TABLET | Freq: Every day | ORAL | Status: AC

## 2015-01-19 MED ORDER — INSULIN ASPART 100 UNIT/ML ~~LOC~~ SOLN: 0.0000 [IU] | Freq: Every day | SUBCUTANEOUS | Status: DC

## 2015-01-19 MED ORDER — ALUM & MAG HYDROXIDE-SIMETH 400-400-40 MG/5ML PO SUSP: 15.0000 mL | Freq: Four times a day (QID) | ORAL | Status: AC | PRN

## 2015-01-19 MED ORDER — INSULIN ASPART 100 UNIT/ML ~~LOC~~ SOLN: 0.0000 [IU] | Freq: Three times a day (TID) | SUBCUTANEOUS | Status: DC

## 2015-01-19 MED ORDER — FOLIC ACID 1 MG PO TABS: 1.0000 mg | ORAL_TABLET | Freq: Every day | ORAL | Status: AC

## 2015-01-19 MED ORDER — BLOOD GLUCOSE MONITOR KIT: PACK | Status: AC

## 2015-01-19 MED ORDER — INSULIN ASPART 100 UNIT/ML ~~LOC~~ SOLN
4.0000 [IU] | Freq: Three times a day (TID) | SUBCUTANEOUS | Status: DC
  Administered 2015-01-19 (×2): 4 [IU] via SUBCUTANEOUS

## 2015-01-19 NOTE — Progress Notes (Signed)
Pt left at this time with his daughter. Alert, oriented, and without c/o. Discharge instructions given/explained with pt verbalizing understanding. Pt aware to pickup prescriptions as drug store pharmacy. Pt aware to make appt with PCP this week. Pt left with cell phone, cane, and clothes.

## 2015-01-19 NOTE — Discharge Summary (Signed)
Physician Discharge Summary  Darrell Day DIY:[id number] DOB: 1960-07-25 DOA: 01/14/2015  PCP: No primary care provider on file.  Admit date: 01/14/2015 Discharge date: 01/19/2015   Recommendations for Outpatient Follow-Up:   1. Will need close follow-up of diabetes control. Instructed to keep a log of his blood sugars and to bring the log with him when he establishes care with a PCP.   Discharge Diagnosis:   Principal Problem:    New onset type 2 diabetes mellitus Active Problems:    Hypokalemia    Hyperglycemia    Nicotine dependence    Hyponatremia    Alcohol dependence    Protein-calorie malnutrition, severe   Discharge disposition:  Home.    Discharge Condition: Improved.  Diet recommendation: Carbohydrate-modified.     History of Present Illness:   Darrell Day is an 54 y.o. male with a PMH of alcoholism who was admitted on 01/14/15 with a chief complaint of dizziness and weakness, found to have profound hypokalemia, hyperglycemia and hyponatremia on presentation. Hemoglobin A1c subsequently found to be 12.4, consistent with diabetes.   Hospital Course by Problem:   Principal Problem:  New onset type 2 diabetes mellitus / hyperglycemia - Discharged home on moderate scale SSI with 4 units of meal coverage. - Taught how to check CBGs and self administer insulin after calculating appropriate dose. - Understands that he needs to get a PCP for close follow-up at discharge.  Active Problems:  Hypokalemia - Potassium WNL after vigorous supplementation.   Nicotine dependence - Provided with nicotine patch.   Hyponatremia - Likely a combination of potomania and hyperglycemia. - Improved with abstinence from alcohol and correction of blood glucoses.   Alcohol dependence - Detoxed with Ativan per CIWA protocol. - Continue thiamine and folic acid supplementation. - Social worker involved in providing resources to the patient and his family. Refuses  placement. - Encouraged to attend AA meetings to maintain sobriety.   Protein-calorie malnutrition, severe - Seen by dietitian 01/15/15. Provided with protein supplementation.  Medical Consultants:    None.   Discharge Exam:   Filed Vitals:   01/19/15 1000  BP: 120/74  Pulse: 87  Temp: 97.5 F (36.4 C)  Resp: 20   Filed Vitals:   01/18/15 1442 01/18/15 2106 01/19/15 0510 01/19/15 1000  BP: 116/75 131/84 125/83 120/74  Pulse: 87 88 84 87  Temp: 98.3 F (36.8 C) 98 F (36.7 C) 97.9 F (36.6 C) 97.5 F (36.4 C)  TempSrc: Oral Oral Oral Oral  Resp: $Remo'17 18 18 20  'PSzzs$ Height:      Weight:      SpO2: 97% 99% 97% 98%    Gen:  NAD Cardiovascular:  RRR, No M/R/G Respiratory: Lungs CTAB Gastrointestinal: Abdomen soft, NT/ND with normal active bowel sounds. Extremities: No C/E/C   The results of significant diagnostics from this hospitalization (including imaging, microbiology, ancillary and laboratory) are listed below for reference.     Procedures and Diagnostic Studies:   Dg Chest 2 View  01/14/2015   CLINICAL DATA:  Weight loss, weakness.  EXAM: CHEST  2 VIEW  COMPARISON:  None.  FINDINGS: The heart size and mediastinal contours are within normal limits. Both lungs are clear. No pneumothorax or pleural effusion is noted. The visualized skeletal structures are unremarkable.  IMPRESSION: No active cardiopulmonary disease.   Electronically Signed   By: Marijo Conception, M.D.   On: 01/14/2015 11:02     Labs:   Basic Metabolic Panel:  Recent Labs Lab 01/14/15 1003  01/14/15 1818 01/15/15 0353 01/17/15 0605  NA 126* 129* 131* 132*  K <2.0* 3.2* 3.1* 4.0  CL <65* 74* 80* 95*  CO2 44* 40* 44* 30  GLUCOSE 431* 438* 178* 198*  BUN $Re'7 9 11 19  'oJI$ CREATININE 0.58* 0.63 0.55* 0.56*  CALCIUM 8.4* 7.9* 7.7* 8.5*  MG 1.6*  --  1.7  --   PHOS 3.1  --   --   --    GFR Estimated Creatinine Clearance: 78.4 mL/min (by C-G formula based on Cr of 0.56). Liver Function  Tests:  Recent Labs Lab 01/14/15 1003 01/15/15 0353  AST 17 13*  ALT 13* 9*  ALKPHOS 128* 114  BILITOT 1.0 0.9  PROT 6.0* 5.3*  ALBUMIN 3.0* 2.6*   CBC:  Recent Labs Lab 01/14/15 1003 01/15/15 0353  WBC 4.5 5.5  NEUTROABS 2.7  --   HGB 13.7 12.1*  HCT 38.5* 35.9*  MCV 91.7 94.7  PLT 211 169   CBG:  Recent Labs Lab 01/18/15 1152 01/18/15 1707 01/18/15 2105 01/19/15 0715 01/19/15 1208  GLUCAP 175* 254* 179* 227* 307*   Microbiology Recent Results (from the past 240 hour(s))  MRSA PCR Screening     Status: None   Collection Time: 01/14/15  1:32 PM  Result Value Ref Range Status   MRSA by PCR NEGATIVE NEGATIVE Final    Comment:        The GeneXpert MRSA Assay (FDA approved for NASAL specimens only), is one component of a comprehensive MRSA colonization surveillance program. It is not intended to diagnose MRSA infection nor to guide or monitor treatment for MRSA infections.   Clostridium Difficile by PCR (not at [facility])     Status: None   Collection Time: 01/16/15  5:43 AM  Result Value Ref Range Status   C difficile by pcr NEGATIVE NEGATIVE Final     Discharge Instructions:   Discharge Instructions    Call MD for:  extreme fatigue    Complete by:  As directed      Call MD for:  persistant nausea and vomiting    Complete by:  As directed      Call MD for:    Complete by:  As directed   Excessive thirst, excessive urination, blood sugars consistently greater than 250.     Diet Carb Modified    Complete by:  As directed      Discharge instructions    Complete by:  As directed   It is VERY IMPORTANT that you follow up with a PCP on a regular basis.  Check your blood glucoses before each meal and at bedtime and maintain a log of your readings.  Bring this log with you when you follow up with your PCP so that he or she can adjust your insulin at your follow up visit.    Take the following sliding scale before your meals: CBG < 70: Drink juice; CBG 70  - 120: 0 units: CBG 121 - 150: 2 units; CBG 151 - 200: 3 units; CBG 201 - 250: 5 units; CBG 251 - 300: 8 units;CBG 301 - 350: 11 units; CBG 351 - 400: 15 units; CBG > 400 Call MD    Take the following sliding scale at bedtime: CBG < 70: Drink juice;       CBG 70 -200 (dose in units): 0 units;  CBG 201 - 250: 2 units; CBG 251 - 300: 3 units; CBG 301 - 350: 4 units; CBG 351 - 400:  5 units;CBG > 400 Call MD     Increase activity slowly    Complete by:  As directed             Medication List    TAKE these medications        alum & mag hydroxide-simeth [phone] MG/5ML suspension  Commonly known as:  MAALOX PLUS  Take 15 mLs by mouth every 6 (six) hours as needed for indigestion.     blood glucose meter kit and supplies Kit  Dispense based on patient and insurance preference. Use up to four times daily as directed. (FOR ICD-9 250.00, 250.01).     folic acid 1 MG tablet  Commonly known as:  FOLVITE  Take 1 tablet (1 mg total) by mouth daily.     insulin aspart 100 UNIT/ML injection  Commonly known as:  novoLOG  Inject 4 Units into the skin 3 (three) times daily with meals. And sliding scale as instructed on your discharge paperwork.     oxyCODONE-acetaminophen 5-325 MG per tablet  Commonly known as:  PERCOCET/ROXICET  Take 1 tablet by mouth every 6 (six) hours as needed for severe pain.     pantoprazole 20 MG tablet  Commonly known as:  PROTONIX  Take 1 tablet (20 mg total) by mouth daily.     thiamine 100 MG tablet  Take 1 tablet (100 mg total) by mouth daily.           Follow-up Information    Call A PCP of your choice.   Why:  01/21/15 for an appt time, follow up diabetes.       Time coordinating discharge: 35 minutes.  Signed:  RAMA,CHRISTINA  Pager [phone] [facility] 01/19/2015, 5:24 PM

## 2015-01-19 NOTE — Discharge Instructions (Signed)
Alcohol and Nutrition °Nutrition serves two purposes. It provides energy. It also maintains body structure and function. Food supplies energy. It also provides the building blocks needed to replace worn or damaged cells. Alcoholics often eat poorly. This limits their supply of essential nutrients. This affects energy supply and structure maintenance. Alcohol also affects the body's nutrients in: °· Digestion. °· Storage. °· Using and getting rid of waste products. °IMPAIRMENT OF NUTRIENT DIGESTION AND UTILIZATION  °· Once ingested, food must be broken down into small components (digested). Then it is available for energy. It helps maintain body structure and function. Digestion begins in the mouth. It continues in the stomach and intestines, with help from the pancreas. The nutrients from digested food are absorbed from the intestines into the blood. Then they are carried to the liver. The liver prepares nutrients for: °· Immediate use. °· Storage and future use. °· Alcohol inhibits the breakdown of nutrients into usable molecules. °· It decreases secretion of digestive enzymes from the pancreas. °· Alcohol impairs nutrient absorption by damaging the cells lining the stomach and intestines. °· It also interferes with moving some nutrients into the blood. °· In addition, nutritional deficiencies themselves may lead to further absorption problems. °· For example, folate deficiency changes the cells that line the small intestine. This impairs how water is absorbed. It also affects absorbed nutrients. These include glucose, sodium, and additional folate. °· Even if nutrients are digested and absorbed, alcohol can prevent them from being fully used. It changes their transport, storage, and excretion. Impaired utilization of nutrients by alcoholics is indicated by: °· Decreased liver stores of vitamins, such as vitamin A. °· Increased excretion of nutrients such as fat. °ALCOHOL AND ENERGY SUPPLY  °· Three basic  nutritional components found in food are: °· Carbohydrates. °· Proteins. °· Fats. °· These are used as energy. Some alcoholics take in as much as 50% of their total daily calories from alcohol. They often neglect important foods. °· Even when enough food is eaten, alcohol can impair the ways the body controls blood sugar (glucose) levels. It may either increase or decrease blood sugar. °· In non-diabetic alcoholics, increased blood sugar (hyperglycemia) is caused by poor insulin secretion. It is usually temporary. °· Decreased blood sugar (hypoglycemia) can cause serious injury even if this condition is short-lived. Low blood sugar can happen when a fasting or malnourished person drinks alcohol. When there is no food to supply energy, stored sugar is used up. The products of alcohol inhibit forming glucose from other compounds such as amino acids. As a result, alcohol causes the brain and other body tissue to lack glucose. It is needed for energy and function. °· Alcohol is an energy source. But how the body processes and uses the energy from alcohol is complex. Also, when alcohol is substituted for carbohydrates, subjects tend to lose weight. This indicates that they get less energy from alcohol than from food. °ALCOHOL - MAINTAINING CELL STRUCTURE AND FUNCTION  °Structure °Cells are made mostly of protein. So an adequate protein diet is important for maintaining cell structure. This is especially true if cells are being damaged. Research indicates that alcohol affects protein nutrition by causing impaired: °· Digestion of proteins to amino acids. °· Processing of amino acids by the small intestine and liver. °· Synthesis of proteins from amino acids. °· Protein secretion by the liver. °Function °Nutrients are essential for the body to function well. They provide the tools that the body needs to work well:  °·   Proteins. °· Vitamins. °· Minerals. °Alcohol can disrupt body function. It may cause nutrient  deficiencies. And it may interfere with the way nutrients are processed. °Vitamins °· Vitamins are essential to maintain growth and normal metabolism. They regulate many of the body`s processes. Chronic heavy drinking causes deficiencies in many vitamins. This is caused by eating less. And, in some cases, vitamins may be poorly absorbed. For example, alcohol inhibits fat absorption. It impairs how the vitamins A, E, and D are normally absorbed along with dietary fats. Not enough vitamin A may cause night blindness. Not enough vitamin D may cause softening of the bones. °· Some alcoholics lack vitamins A, C, D, E, K, and the B vitamins. These are all involved in wound healing and cell maintenance. In particular, because vitamin K is necessary for blood clotting, lacking that vitamin can cause delayed clotting. The result is excess bleeding. Lacking other vitamins involved in brain function may cause severe neurological damage. °Minerals °Deficiencies of minerals such as calcium, magnesium, iron, and zinc are common in alcoholics. The alcohol itself does not seem to affect how these minerals are absorbed. Rather, they seem to occur secondary to other alcohol-related problems, such as: °· Less calcium absorbed. °· Not enough magnesium. °· More urinary excretion. °· Vomiting. °· Diarrhea. °· Not enough iron due to gastrointestinal bleeding. °· Not enough zinc or losses related to other nutrient deficiencies. °· Mineral deficiencies can cause a variety of medical consequences. These range from calcium-related bone disease to zinc-related night blindness and skin lesions. °ALCOHOL, MALNUTRITION, AND MEDICAL COMPLICATIONS  °Liver Disease  °· Alcoholic liver damage is caused primarily by alcohol itself. But poor nutrition may increase the risk of alcohol-related liver damage. For example, nutrients normally found in the liver are known to be affected by drinking alcohol. These include carotenoids, which are the major  sources of vitamin A, and vitamin E compounds. Decreases in such nutrients may play some role in alcohol-related liver damage. °Pancreatitis °· Research suggests that malnutrition may increase the risk of developing alcoholic pancreatitis. Research suggests that a diet lacking in protein may increase alcohol's damaging effect on the pancreas. °Brain °· Nutritional deficiencies may have severe effects on brain function. These may be permanent. Specifically, thiamine deficiencies are often seen in alcoholics. They can cause severe neurological problems. These include: °¨ Impaired movement. °¨ Memory loss seen in Wernicke-Korsakoff syndrome. °Pregnancy °· Alcohol has toxic effects on fetal development. It causes alcohol-related birth defects. They include fetal alcohol syndrome. Alcohol itself is toxic to the fetus. Also, the nutritional deficiency can affect how the fetus develops. That may compound the risk of developmental damage. °· Nutritional needs during pregnancy are 10% to 30% greater than normal. Food intake can increase by as much as 140% to cover the needs of both mother and fetus. An alcoholic mother`s nutritional problems may adversely affect the nutrition of the fetus. And alcohol itself can also restrict nutrition flow to the fetus. °NUTRITIONAL STATUS OF ALCOHOLICS  °Techniques for assessing nutritional status include: °· Taking body measurements to estimate fat reserves. They include: °¨ Weight. °¨ Height. °¨ Mass. °¨ Skin fold thickness. °· Performing blood analysis to provide measurements of circulating: °¨ Proteins. °¨ Vitamins. °¨ Minerals. °· These techniques tend to be imprecise. For many nutrients, there is no clear "cut-off" point that would allow an accurate definition of deficiency. So assessing the nutritional status of alcoholics is limited by these techniques. Dietary status may provide information about the risk of developing nutritional problems.   Dietary status is assessed by:  Taking  patients' dietary histories.  Evaluating the amount and types of food they are eating.  It is difficult to determine what exact amount of alcohol begins to have damaging effects on nutrition. In general, moderate drinkers have 2 drinks or less per day. They seem to be at little risk for nutritional problems. Various medical disorders begin to appear at greater levels.  Research indicates that the majority of even the heaviest drinkers have few obvious nutritional deficiencies. Many alcoholics who are hospitalized for medical complications of their disease do have severe malnutrition. Alcoholics tend to eat poorly. Often they eat less than the amounts of food necessary to provide enough:  Carbohydrates.  Protein.  Fat.  Vitamins A and C.  B vitamins.  Minerals like calcium and iron. Of major concern is alcohol's effect on digesting food and use of nutrients. It may shift a mildly malnourished person toward severe malnutrition. Document Released: 04/29/2005 Document Revised: 09/27/2011 Document Reviewed: 10/13/2005 [facility] Patient Information 2015 [place], [place]. This information is not intended to replace advice given to you by your health care provider. Make sure you discuss any questions you have with your health care provider.  Correction Insulin Your health care provider has decided you need to take insulin regularly. You have been given a correction scale (also called a sliding scale) in case you need extra insulin when your blood sugar is too high (hyperglycemia). The following instructions will assist you in how to use that correction scale.  WHAT IS A CORRECTION SCALE?  When you check your blood sugar, sometimes it will be higher than your health care provider has told you it should be. You may need an extra dose of insulin to bring your blood sugar to the recommended level (also known as your goal, target, or normal level). The correction scale is prescribed by your health  care provider based on your specific needs.  Your correction scale has two parts:   The first shows you a blood sugar range.   The second part tells you how much extra insulin to give yourself if your blood sugar falls within this range. You will not need an extra dose of insulin if your blood glucose is in the desired range. You should simply give yourself the normal amount of insulin that your health care provider has ordered for you.  WHY IS IT IMPORTANT TO KEEP YOUR BLOOD SUGAR LEVELS AT YOUR DESIRED LEVEL?  Keeping your blood sugar at the desired level helps to prevent long-term complications of diabetes, such as eye disease, kidney failure, nerve damage, and other serious complications. WHAT TYPE OF INSULIN WILL YOU USE?  To help bring down blood sugar levels that are too high, your health care provider will prescribe a short-acting or a rapid-acting insulin. An example of a short-acting insulin would be regular insulin. Remember, you may also have a longer-acting insulin prescribed for you.  WHAT DO YOU NEED TO DO?   Check your blood sugar with your home blood glucose meter as recommended by your health care provider.   Using your correction scale, find the range that your blood sugar lies in.   Look for the units of insulin that match that blood sugar range. Give yourself the dose of correction insulin your health care provider has prescribed. Always make sure you are using the right type of insulin.   Prior to the injection, make sure you have food available that you can eat in the next  15-30 minutes.   If your correction insulin is rapid acting, start eating your meal within 15 minutes after you have given yourself the insulin injection. If you wait longer than 15 minutes to eat, your blood sugar might get too low.   If your correction insulin is short acting(regular), start eating your meal within 30 minutes after you have given yourself the insulin injection. If you  wait longer than 30 minutes to eat, your blood sugar might get too low. Symptoms of low blood sugar (hypoglycemia) may include feeling shaky or weak, sweating, feeling confused, difficulty seeing, agitation, crankiness, or numbness of the lips or tongue. Check your blood sugar immediately and treat your results as directed by your health care provider.   Keep a log of your blood sugar results with the time you took the test and the amount of insulin that you injected. This information will help your health care provider manage your medicines.   Note on your log anything that may affect your blood sugar level, such as:   Changes in normal exercise or activity.   Changes in your normal schedule, such as staying up late, going on vacation, changing your diet, or holidays.   New medicines. This includes prescription and over-the-counter medicines. Some medicines may cause high blood sugar.   Sickness, stress, or anxiety.   Changes in the time you took your medicine.   Changes in your meals, such as skipping a meal, having a late meal, or dining out.   Eating things that may affect blood glucose, such as snacks, meal portions that are larger than normal, drinks with sugar, or eating less than usual.   Ask your health care provider any questions you have.  Be aware of "stacking" your insulin doses. This happens when you correct a high blood sugar level by giving yourself extra insulin too soon after a previous correction dose or mealtime dose. You may then have too much insulin still active in your body and may be at risk for hypoglycemia. WHY DO YOU NEED A CORRECTION SCALE IF YOU HAVE NEVER BEEN DIAGNOSED WITH DIABETES?   Keeping your blood glucose in the target range is important for your overall health.   You may have been prescribed medicines that cause your blood glucose to be higher than normal. WHEN SHOULD YOU SEEK MEDICAL CARE? Contact your health care provider if:   You  have experienced hypoglycemia that you are unable to treat with your usual routine.   You have a high blood sugar level that is not coming down with the correction dose.  Your blood sugar is often too low or does not come up even if you eat a fast-acting carbohydrate. Someone who lives with you should seek immediate medical care if you become unresponsive. Document Released: 11/26/2010 Document Revised: 03/07/2013 Document Reviewed: 12/15/2012 [facility] Patient Information 2015 [place], [place]. This information is not intended to replace advice given to you by your health care provider. Make sure you discuss any questions you have with your health care provider.  Diabetes Mellitus and Food It is important for you to manage your blood sugar (glucose) level. Your blood glucose level can be greatly affected by what you eat. Eating healthier foods in the appropriate amounts throughout the day at about the same time each day will help you control your blood glucose level. It can also help slow or prevent worsening of your diabetes mellitus. Healthy eating may even help you improve the level of your blood pressure and  reach or maintain a healthy weight.  HOW CAN FOOD AFFECT ME? Carbohydrates Carbohydrates affect your blood glucose level more than any other type of food. Your dietitian will help you determine how many carbohydrates to eat at each meal and teach you how to count carbohydrates. Counting carbohydrates is important to keep your blood glucose at a healthy level, especially if you are using insulin or taking certain medicines for diabetes mellitus. Alcohol Alcohol can cause sudden decreases in blood glucose (hypoglycemia), especially if you use insulin or take certain medicines for diabetes mellitus. Hypoglycemia can be a life-threatening condition. Symptoms of hypoglycemia (sleepiness, dizziness, and disorientation) are similar to symptoms of having too much alcohol.  If your health care  provider has given you approval to drink alcohol, do so in moderation and use the following guidelines:  Women should not have more than one drink per day, and men should not have more than two drinks per day. One drink is equal to:  12 oz of beer.  5 oz of wine.  1 oz of hard liquor.  Do not drink on an empty stomach.  Keep yourself hydrated. Have water, diet soda, or unsweetened iced tea.  Regular soda, juice, and other mixers might contain a lot of carbohydrates and should be counted. WHAT FOODS ARE NOT RECOMMENDED? As you make food choices, it is important to remember that all foods are not the same. Some foods have fewer nutrients per serving than other foods, even though they might have the same number of calories or carbohydrates. It is difficult to get your body what it needs when you eat foods with fewer nutrients. Examples of foods that you should avoid that are high in calories and carbohydrates but low in nutrients include:  Trans fats (most processed foods list trans fats on the Nutrition Facts label).  Regular soda.  Juice.  Candy.  Sweets, such as cake, pie, doughnuts, and cookies.  Fried foods. WHAT FOODS CAN I EAT? Have nutrient-rich foods, which will nourish your body and keep you healthy. The food you should eat also will depend on several factors, including:  The calories you need.  The medicines you take.  Your weight.  Your blood glucose level.  Your blood pressure level.  Your cholesterol level. You also should eat a variety of foods, including:  Protein, such as meat, poultry, fish, tofu, nuts, and seeds (lean animal proteins are best).  Fruits.  Vegetables.  Dairy products, such as milk, cheese, and yogurt (low fat is best).  Breads, grains, pasta, cereal, rice, and beans.  Fats such as olive oil, trans fat-free margarine, canola oil, avocado, and olives. DOES EVERYONE WITH DIABETES MELLITUS HAVE THE SAME MEAL PLAN? Because every  person with diabetes mellitus is different, there is not one meal plan that works for everyone. It is very important that you meet with a dietitian who will help you create a meal plan that is just right for you. Document Released: 04/01/2005 Document Revised: 07/10/2013 Document Reviewed: 06/01/2013 [facility] Patient Information 2015 [place]. This information is not intended to replace advice given to you by your health care provider. Make sure you discuss any questions you have with your health care provider.

## 2015-06-04 ENCOUNTER — Telehealth: Payer: Self-pay

## 2015-06-04 NOTE — Telephone Encounter (Signed)
Received records request Disability Determination SErvices, forwarded to [employer] for processing. 

## 2015-06-06 ENCOUNTER — Telehealth: Payer: Self-pay | Admitting: *Deleted

## 2015-06-06 NOTE — Telephone Encounter (Signed)
Received records request Disability Determination Services , forwarded to [employer] for processing 06/06/15. °  °

## 2016-04-29 IMAGING — CR DG CHEST 2V
2 series · 2 of 2 positions shown · non-contrast
Comparison: None.

CLINICAL DATA: Weight loss, weakness.

EXAM:
CHEST  2 VIEW

[w chest pa]
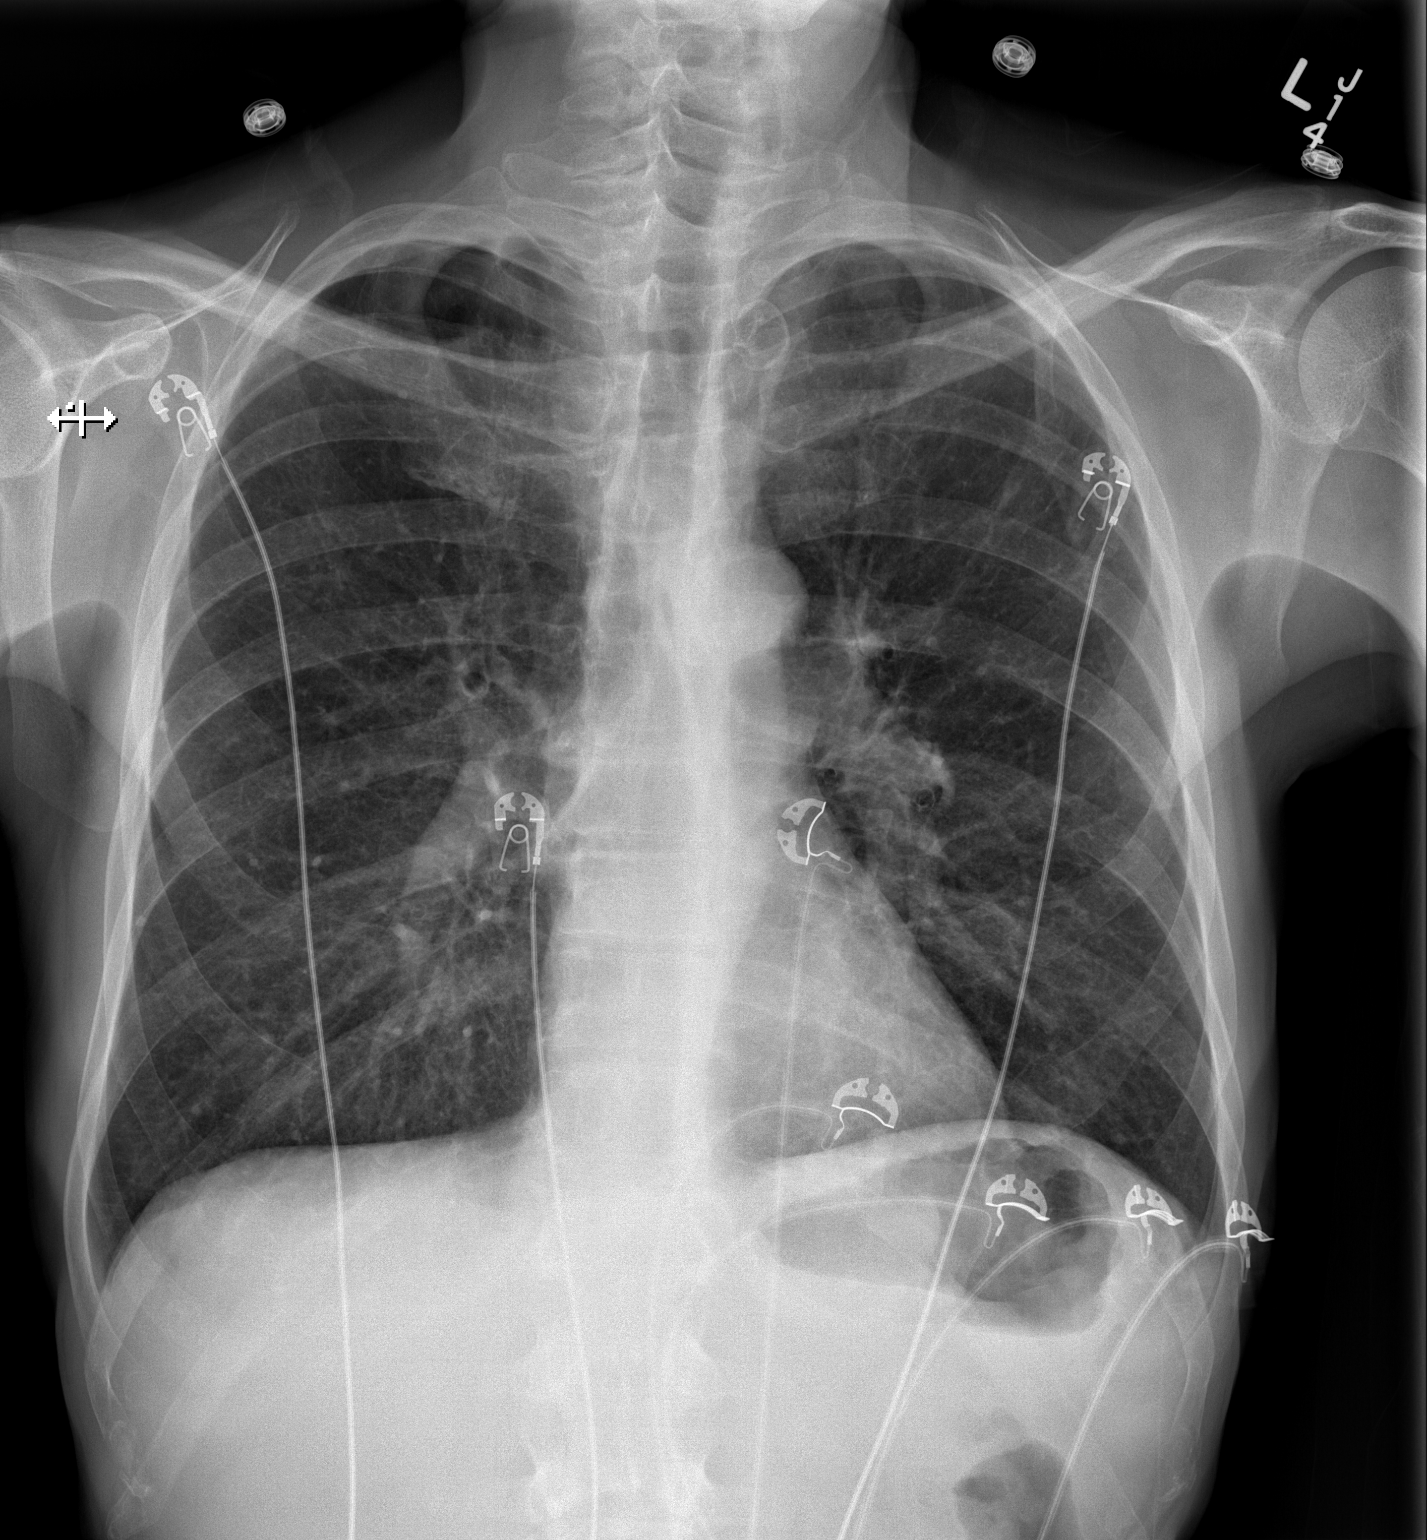

[w chest lat]
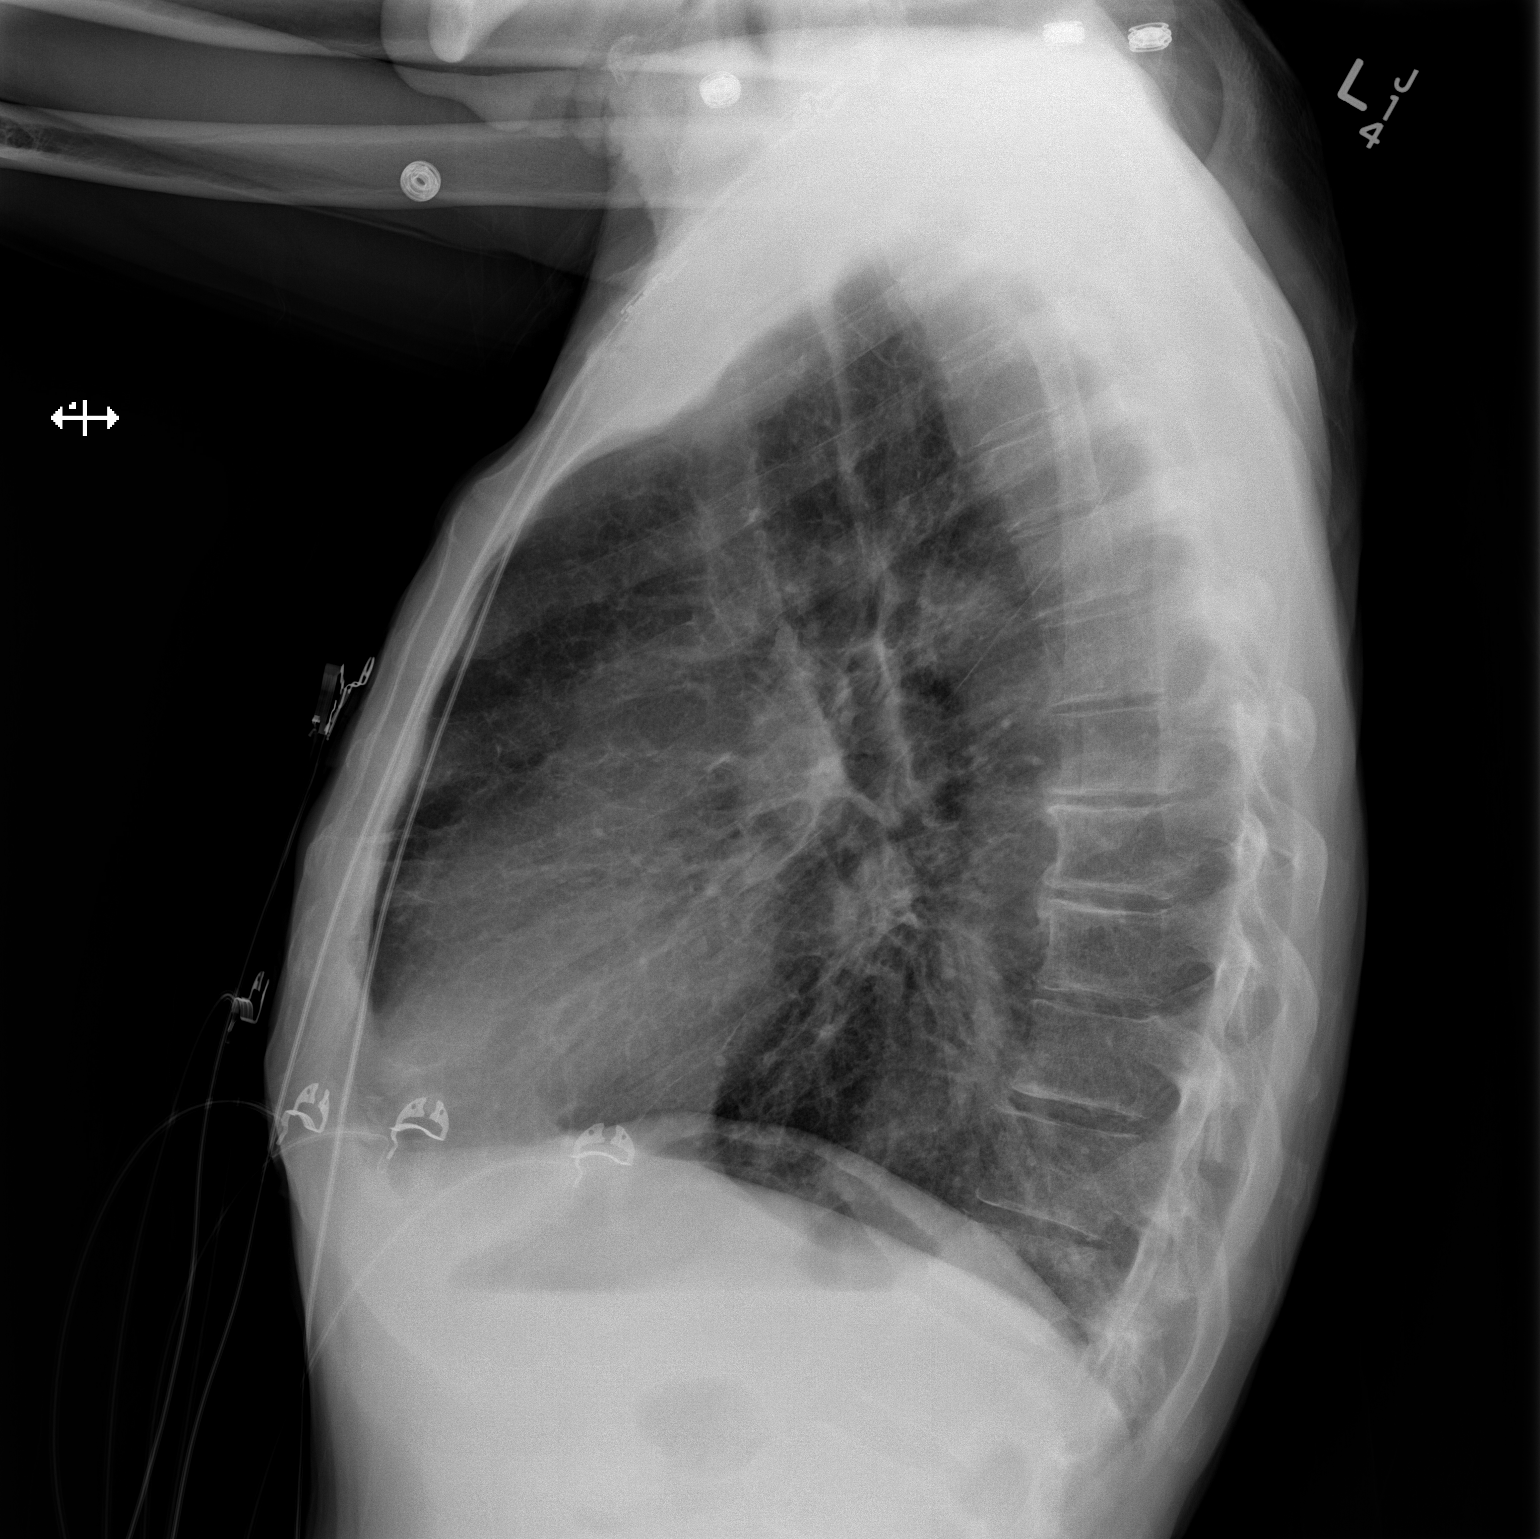

[2 of 2 positions shown; findings below may reference images not displayed]

FINDINGS: The heart size and mediastinal contours are within normal limits.
Both lungs are clear. No pneumothorax or pleural effusion is noted.
The visualized skeletal structures are unremarkable.
IMPRESSION: No active cardiopulmonary disease.

## 2016-08-11 ENCOUNTER — Other Ambulatory Visit: Payer: Self-pay | Admitting: *Deleted

## 2016-08-19 DEATH — deceased
